# Patient Record
Sex: Female | Born: 1980 | Race: White | Hispanic: No | Marital: Married | Smoking: Never smoker
Health system: Southern US, Community
[De-identification: ages and names within clinical notes are randomized; demographics above are authoritative.]

## PROBLEM LIST (undated history)

## (undated) DIAGNOSIS — F909 Attention-deficit hyperactivity disorder, unspecified type: Secondary | ICD-10-CM

## (undated) DIAGNOSIS — F32A Depression, unspecified: Secondary | ICD-10-CM

## (undated) DIAGNOSIS — F419 Anxiety disorder, unspecified: Secondary | ICD-10-CM

## (undated) DIAGNOSIS — O24419 Gestational diabetes mellitus in pregnancy, unspecified control: Secondary | ICD-10-CM

## (undated) DIAGNOSIS — J45909 Unspecified asthma, uncomplicated: Secondary | ICD-10-CM

## (undated) DIAGNOSIS — F329 Major depressive disorder, single episode, unspecified: Secondary | ICD-10-CM

## (undated) DIAGNOSIS — R87619 Unspecified abnormal cytological findings in specimens from cervix uteri: Secondary | ICD-10-CM

## (undated) DIAGNOSIS — T7840XA Allergy, unspecified, initial encounter: Secondary | ICD-10-CM

## (undated) DIAGNOSIS — Z87820 Personal history of traumatic brain injury: Secondary | ICD-10-CM

## (undated) DIAGNOSIS — K219 Gastro-esophageal reflux disease without esophagitis: Secondary | ICD-10-CM

## (undated) HISTORY — DX: Major depressive disorder, single episode, unspecified: F32.9

## (undated) HISTORY — PX: NO PAST SURGERIES: SHX2092

## (undated) HISTORY — DX: Unspecified asthma, uncomplicated: J45.909

## (undated) HISTORY — DX: Anxiety disorder, unspecified: F41.9

## (undated) HISTORY — PX: LEEP: SHX91

## (undated) HISTORY — DX: Personal history of traumatic brain injury: Z87.820

## (undated) HISTORY — DX: Depression, unspecified: F32.A

## (undated) HISTORY — PX: WISDOM TOOTH EXTRACTION: SHX21

## (undated) HISTORY — DX: Unspecified abnormal cytological findings in specimens from cervix uteri: R87.619

## (undated) HISTORY — DX: Allergy, unspecified, initial encounter: T78.40XA

## (undated) HISTORY — DX: Gestational diabetes mellitus in pregnancy, unspecified control: O24.419

## (undated) HISTORY — DX: Gastro-esophageal reflux disease without esophagitis: K21.9

## (undated) HISTORY — DX: Attention-deficit hyperactivity disorder, unspecified type: F90.9

## (undated) NOTE — *Deleted (*Deleted)
Chief Complaint:  Non-stress Test (further eval )   First Provider Initiated Contact with Patient 07/24/20 1844      HPI: Katrina Phillips is a 46 y.o. G3P1010 at [redacted]w[redacted]d who presents to maternity admissions sent from the office for variable decelerations on NST and irritability on tocometry.  She is in antenatal testing for hx IUFD at 34 weeks with previous pregnancy.  Pt denies feeling any cramping/contractions and is feeling normal fetal movement.      Other This is a new problem. The current episode started today. Pertinent negatives include no abdominal pain, chest pain, chills, fatigue, fever, headaches, nausea or vomiting.    Past Medical History: Past Medical History:  Diagnosis Date  . Abnormal Pap smear of cervix 03/15/2017   HR HPV: +Detected  . ADHD   . Allergy    SEASONAL  . Anxiety   . Asthma    AS A CHILD  . Depression   . H/O concussion 1995    Past obstetric history: OB History  Gravida Para Term Preterm AB Living  3 1 1  0 1 0  SAB TAB Ectopic Multiple Live Births  0 1 0 0 0    # Outcome Date GA Lbr Len/2nd Weight Sex Delivery Anes PTL Lv  3 Current           2 Term 09/08/19 [redacted]w[redacted]d 00:46 / 00:20 2705 g F Vag-Spont EPI  FD  1 TAB 2015            Past Surgical History: Past Surgical History:  Procedure Laterality Date  . COLPOSCOPY  2006  . NO PAST SURGERIES    . WISDOM TOOTH EXTRACTION      Family History: Family History  Problem Relation Age of Onset  . ADD / ADHD Mother   . GER disease Mother   . ADD / ADHD Sister   . Depression Brother   . ADD / ADHD Brother   . Celiac disease Father   . Depression Father   . Depression Maternal Grandmother   . GER disease Maternal Grandfather   . Heart disease Maternal Grandfather   . Celiac disease Paternal Grandmother   . Kidney disease Paternal Grandmother   . Celiac disease Paternal Uncle   . Melanoma Paternal Aunt     Social History: Social History   Tobacco Use  . Smoking status: Never Smoker   . Smokeless tobacco: Never Used  Vaping Use  . Vaping Use: Never used  Substance Use Topics  . Alcohol use: Not Currently    Alcohol/week: 1.0 - 4.0 standard drink    Types: 1 - 4 Standard drinks or equivalent per week    Comment: social  . Drug use: No    Allergies: No Known Allergies  Meds:  Medications Prior to Admission  Medication Sig Dispense Refill Last Dose  . acetaminophen (TYLENOL) 500 MG tablet Take 500 mg by mouth every 6 (six) hours as needed for headache.   Past Month at Unknown time  . aspirin EC 81 MG tablet Take 81 mg by mouth daily. Swallow whole.   07/24/2020 at Unknown time  . ferrous sulfate 325 (65 FE) MG tablet Take 325 mg by mouth daily with breakfast.   07/24/2020 at Unknown time  . metFORMIN (GLUCOPHAGE) 1000 MG tablet Take 1,000 mg by mouth 2 (two) times daily with a meal.   07/24/2020 at Unknown time  . Prenatal Vit-Fe Fumarate-FA (PRENATAL PO) Take 1 tablet by mouth daily.    07/24/2020  at Unknown time  . buPROPion (WELLBUTRIN XL) 300 MG 24 hr tablet Take 1 tablet (300 mg total) by mouth daily. (Patient not taking: Reported on 09/07/2019) 30 tablet 1   . calcium carbonate (TUMS - DOSED IN MG ELEMENTAL CALCIUM) 500 MG chewable tablet Chew 1-2 tablets by mouth as needed for indigestion or heartburn.     Marland Kitchen FLUoxetine (PROZAC) 20 MG tablet Take 1 tablet (20 mg total) by mouth daily. (Patient taking differently: Take 40 mg by mouth daily. ) 30 tablet 1   . ibuprofen (ADVIL) 800 MG tablet Take 1 tablet (800 mg total) by mouth every 8 (eight) hours as needed. 30 tablet 6     ROS:  Review of Systems  Constitutional: Negative for chills, fatigue and fever.  Eyes: Negative for visual disturbance.  Respiratory: Negative for shortness of breath.   Cardiovascular: Negative for chest pain.  Gastrointestinal: Negative for abdominal pain, nausea and vomiting.  Genitourinary: Negative for difficulty urinating, dysuria, flank pain, pelvic pain, vaginal bleeding, vaginal  discharge and vaginal pain.  Neurological: Negative for dizziness and headaches.  Psychiatric/Behavioral: Negative.      I have reviewed patient's Past Medical Hx, Surgical Hx, Family Hx, Social Hx, medications and allergies.   Physical Exam   Patient Vitals for the past 24 hrs:  BP Temp Temp src Pulse Resp SpO2 Weight  07/24/20 2039 110/70 - - - - - -  07/24/20 2016 107/68 - - - - - -  07/24/20 1822 - - - - - - 89 kg  07/24/20 1820 116/70 - - 88 - 94 % -  07/24/20 1818 - 98.7 F (37.1 C) Oral - 12 94 % -  07/24/20 1756 - - - - - - 89 kg   Constitutional: Well-developed, well-nourished female in no acute distress.  Cardiovascular: normal rate Respiratory: normal effort GI: Abd soft, non-tender, gravid appropriate for gestational age.  MS: Extremities nontender, no edema, normal ROM Neurologic: Alert and oriented x 4.  GU: Neg CVAT.    Dilation: 1.5 Effacement (%): 60 Cervical Position: Posterior Presentation: Vertex Exam by:: Sharen Counter, CNM  FHT:  Baseline 135 , moderate variability, accelerations present, no decelerations Contractions: q 2-3 minutes, mild to palpation   Labs: Results for orders placed or performed during the hospital encounter of 07/24/20 (from the past 24 hour(s))  Urinalysis, Routine w reflex microscopic Urine, Clean Catch     Status: Abnormal   Collection Time: 07/24/20  7:02 PM  Result Value Ref Range   Color, Urine YELLOW YELLOW   APPearance HAZY (A) CLEAR   Specific Gravity, Urine 1.014 1.005 - 1.030   pH 6.0 5.0 - 8.0   Glucose, UA NEGATIVE NEGATIVE mg/dL   Hgb urine dipstick NEGATIVE NEGATIVE   Bilirubin Urine NEGATIVE NEGATIVE   Ketones, ur 5 (A) NEGATIVE mg/dL   Protein, ur NEGATIVE NEGATIVE mg/dL   Nitrite NEGATIVE NEGATIVE   Leukocytes,Ua TRACE (A) NEGATIVE   RBC / HPF 0-5 0 - 5 RBC/hpf   WBC, UA 0-5 0 - 5 WBC/hpf   Bacteria, UA RARE (A) NONE SEEN   Squamous Epithelial / LPF 0-5 0 - 5   Mucus PRESENT   Fetal  fibronectin     Status: None   Collection Time: 07/24/20  7:16 PM  Result Value Ref Range   Fetal Fibronectin NEGATIVE NEGATIVE   --/--/A POS, A POS Performed at [facility], [address] [place], [place] [address]  (11/26 0900)  Imaging:  No  results found.  MAU Course/MDM: Orders Placed This Encounter  Procedures  . [place] MFM OB Limited  . Urinalysis, Routine w reflex microscopic Urine, Clean Catch  . Fetal fibronectin    Meds ordered this encounter  Medications  . NIFEdipine (PROCARDIA) capsule 10 mg     NST reviewed and reactive Limited OB US  Report to Wynelle Bourgeois, CNM   Sharen Counter Certified Nurse-Midwife 07/24/2020 9:06 PM

---
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
## 2004-10-12 HISTORY — PX: COLPOSCOPY: SHX161

## 2016-05-14 DIAGNOSIS — Z23 Encounter for immunization: Secondary | ICD-10-CM | POA: Diagnosis not present

## 2016-06-25 DIAGNOSIS — F3341 Major depressive disorder, recurrent, in partial remission: Secondary | ICD-10-CM | POA: Diagnosis not present

## 2016-07-21 DIAGNOSIS — F3341 Major depressive disorder, recurrent, in partial remission: Secondary | ICD-10-CM | POA: Diagnosis not present

## 2016-07-23 DIAGNOSIS — F411 Generalized anxiety disorder: Secondary | ICD-10-CM | POA: Diagnosis not present

## 2016-08-03 DIAGNOSIS — R5383 Other fatigue: Secondary | ICD-10-CM | POA: Diagnosis not present

## 2016-08-03 DIAGNOSIS — D229 Melanocytic nevi, unspecified: Secondary | ICD-10-CM | POA: Diagnosis not present

## 2016-08-03 DIAGNOSIS — G44209 Tension-type headache, unspecified, not intractable: Secondary | ICD-10-CM | POA: Diagnosis not present

## 2016-08-03 DIAGNOSIS — R109 Unspecified abdominal pain: Secondary | ICD-10-CM | POA: Diagnosis not present

## 2016-08-17 DIAGNOSIS — F3341 Major depressive disorder, recurrent, in partial remission: Secondary | ICD-10-CM | POA: Diagnosis not present

## 2016-09-22 ENCOUNTER — Encounter: Payer: Self-pay | Admitting: Gastroenterology

## 2016-09-25 DIAGNOSIS — R5383 Other fatigue: Secondary | ICD-10-CM | POA: Diagnosis not present

## 2016-09-29 DIAGNOSIS — F3341 Major depressive disorder, recurrent, in partial remission: Secondary | ICD-10-CM | POA: Diagnosis not present

## 2016-10-01 DIAGNOSIS — R197 Diarrhea, unspecified: Secondary | ICD-10-CM | POA: Diagnosis not present

## 2016-10-13 DIAGNOSIS — D2271 Melanocytic nevi of right lower limb, including hip: Secondary | ICD-10-CM | POA: Diagnosis not present

## 2016-10-13 DIAGNOSIS — D210 Benign neoplasm of connective and other soft tissue of head, face and neck: Secondary | ICD-10-CM | POA: Diagnosis not present

## 2016-10-13 DIAGNOSIS — D485 Neoplasm of uncertain behavior of skin: Secondary | ICD-10-CM | POA: Diagnosis not present

## 2016-11-03 ENCOUNTER — Encounter: Payer: Self-pay | Admitting: *Deleted

## 2016-11-09 ENCOUNTER — Encounter: Payer: Self-pay | Admitting: Gastroenterology

## 2016-11-09 ENCOUNTER — Encounter (INDEPENDENT_AMBULATORY_CARE_PROVIDER_SITE_OTHER): Payer: Self-pay

## 2016-11-09 ENCOUNTER — Ambulatory Visit (INDEPENDENT_AMBULATORY_CARE_PROVIDER_SITE_OTHER): Payer: BLUE CROSS/BLUE SHIELD | Admitting: Gastroenterology

## 2016-11-09 VITALS — BP 120/70 | HR 76 | Ht 68.0 in | Wt 179.0 lb

## 2016-11-09 DIAGNOSIS — R197 Diarrhea, unspecified: Secondary | ICD-10-CM | POA: Diagnosis not present

## 2016-11-09 DIAGNOSIS — Z8379 Family history of other diseases of the digestive system: Secondary | ICD-10-CM

## 2016-11-09 DIAGNOSIS — R110 Nausea: Secondary | ICD-10-CM | POA: Diagnosis not present

## 2016-11-09 DIAGNOSIS — K219 Gastro-esophageal reflux disease without esophagitis: Secondary | ICD-10-CM | POA: Diagnosis not present

## 2016-11-09 MED ORDER — RANITIDINE HCL 150 MG PO TABS: 150.0000 mg | ORAL_TABLET | Freq: Two times a day (BID) | ORAL | 3 refills | Status: DC

## 2016-11-09 NOTE — Progress Notes (Signed)
Katrina Phillips    [id number]    Nov 16, 1980  Primary Care Physician:Jennifer Owens Shark, NP  Referring Physician: No referring provider defined for this encounter.  Chief complaint: Nausea, Diarrhea, Heartburn, Abdominal cramps  HPI: 36 year old female here for new patient evaluation with complaints of intermittent nausea, diarrhea and abdominal cramps associated with bloating for the past many years She also has had heartburn for many years, thinks she had heartburn even as a teenager and had some testing done at the time in [place] that was unremarkable per patient. Arterburn is mostly controlled with over-the-counter Tums or Pepto-Bismol. She has family history of celiac disease in her paternal grandmother, paternal uncle and her father. Quality really stopped eating gluten about 4 years ago though cannot rule out accidental consumption as she does eat out frequently. She has about 6-7 semi-formed bowel movements a day on her worst days. She had celiac panel tested by PMD that came back negative after she challenged herself with gluten for a week.  Denies any dysphagia, vomiting, abdominal pain, melena or blood per rectum.   Outpatient Encounter Prescriptions as of 11/09/2016  Medication Sig  . amphetamine-dextroamphetamine (ADDERALL) 10 MG tablet Take 10 mg by mouth daily.  Marland Kitchen FLUoxetine (PROZAC) 40 MG capsule Take 1 capsule by mouth daily.  Marland Kitchen VYVANSE 20 MG capsule Take 20 mg by mouth daily.   No facility-administered encounter medications on file as of 11/09/2016.     Allergies as of 11/09/2016  . (No Known Allergies)    Past Medical History:  Diagnosis Date  . ADHD   . Asthma   . Depression   . H/O concussion 1995    Past Surgical History:  Procedure Laterality Date  . COLPOSCOPY  2006  . WISDOM TOOTH EXTRACTION      Family History  Problem Relation Age of Onset  . ADD / ADHD Mother   . GER disease Mother   . ADD / ADHD Sister   . Depression  Brother   . ADD / ADHD Brother   . Celiac disease Father   . Depression Father   . Depression Maternal Grandmother   . GER disease Maternal Grandfather   . Heart disease Maternal Grandfather   . Celiac disease Paternal Grandmother   . Kidney disease Paternal Grandmother   . Celiac disease Paternal Uncle   . Melanoma Paternal Aunt     Social History   Social History  . Marital status: Single    Spouse name: N/A  . Number of children: 0  . Years of education: N/A   Occupational History  . grad student/assistant    Social History Main Topics  . Smoking status: Never Smoker  . Smokeless tobacco: Never Used  . Alcohol use 1.2 oz/week    2 Glasses of wine per week     Comment: social  . Drug use: No  . Sexual activity: Not on file   Other Topics Concern  . Not on file   Social History Narrative  . No narrative on file      Review of systems: Review of Systems  Constitutional: Negative for fever and chills.  HENT: Negative.   Eyes: Negative for blurred vision.  Respiratory: Negative for cough, shortness of breath and wheezing.   Cardiovascular: Negative for chest pain and palpitations.  Gastrointestinal: as per HPI Genitourinary: Negative for dysuria, urgency, frequency and hematuria.  Musculoskeletal: Negative for myalgias, back pain and joint pain.  Skin:  Negative for itching and rash.  Neurological: Negative for dizziness, tremors, focal weakness, seizures and loss of consciousness.  Endo/Heme/Allergies: Positive for seasonal allergies.  Psychiatric/Behavioral: Negative for depression, suicidal ideas and hallucinations.  All other systems reviewed and are negative.   Physical Exam: Vitals:   11/09/16 1353  BP: 120/70  Pulse: 76   Body mass index is 27.22 kg/m. Gen:      No acute distress HEENT:  EOMI, sclera anicteric Neck:     No masses; no thyromegaly Lungs:    Clear to auscultation bilaterally; normal respiratory effort CV:         Regular rate  and rhythm; no murmurs Abd:      + bowel sounds; soft, non-tender; no palpable masses, no distension Ext:    No edema; adequate peripheral perfusion Skin:      Warm and dry; no rash Neuro: alert and oriented x 3 Psych: normal mood and affect  Data Reviewed:  Reviewed labs, radiology imaging, old records and pertinent past GI work up   Assessment and Plan/Recommendations:  36 year old female with chronic history of intermittent nausea, heartburn, diarrhea associated with bloating and abdominal cramps She has family history of celiac disease but recent celiac panel checked by PMD after challenge of gluten diet for a week was negative  Heartburn and nausea could be secondary to untreated gastroesophageal reflux disease Start ranitidine 150 mg twice daily when necessary Antireflux measures  Diarrhea, bloating and abdominal cramps: Advised patient to do a trial of lactose-free diet for 2 weeks to see if has any improvement of symptoms We'll rechallenge patient with gluten for 2 weeks and recheck a TTG, IgA, ferritin, iron panel and also check for HLA DQ 2 and DQ 8 Schedule for EGD with duodenal biopsies The risks and benefits as well as alternatives of endoscopic procedure(s) have been discussed and reviewed. All questions answered. The patient agrees to proceed.  Greater than 50% of the time used for counseling as well as treatment plan and follow-up. She had multiple questions which were answered to her satisfaction  K. Denzil Magnuson , MD [phone] Mon-Fri 8a-5p [phone] after 5p, weekends, holidays  CC: No ref. provider found

## 2016-11-09 NOTE — Patient Instructions (Signed)
If you are age 34 or older, your body mass index should be between 23-30. Your Body mass index is 27.22 kg/m. If this is out of the aforementioned range listed, please consider follow up with your Primary Care Provider.  If you are age 66 or younger, your body mass index should be between 19-25. Your Body mass index is 27.22 kg/m. If this is out of the aformentioned range listed, please consider follow up with your Primary Care Provider.    Lactose-Free Diet, Adult Introduction If you have lactose intolerance, you are not able to digest lactose. Lactose is a natural sugar found mainly in milk and milk products. You may need to avoid all foods and beverages that contain lactose. A lactose-free diet can help you do this. What do I need to know about this diet?  Do not consume foods, beverages, vitamins, minerals, or medicines with lactose. Read ingredients lists carefully.  Look for the words "lactose-free" on labels.  Use lactase enzyme drops or tablets as directed by your health care provider.  Use lactose-free milk or a milk alternative, such as soy milk, for drinking and cooking.  Make sure you get enough calcium and vitamin D in your diet. A lactose-free eating plan can be lacking in these important nutrients.  Take calcium and vitamin D supplements as directed by your health care provider. Talk to your provider about supplements if you are not able to get enough calcium and vitamin D from food. Which foods have lactose? Lactose is found in:  Milk and foods made from milk.  Yogurt.  Cheese.  Butter.  Margarine.  Sour cream.  Cream.  Whipped toppings and nondairy creamers.  Ice cream and other milk-based desserts. Lactose is also found in foods or products made with milk or milk ingredients. To find out whether a food contains milk or a milk ingredient, look at the ingredients list. Avoid foods with the statement "May contain milk" and foods that  contain:  Butter.  Cream.  Milk.  Milk solids.  Milk powder.  Whey.  Curd.  Caseinate.  Lactose.  Lactalbumin.  Lactoglobulin. What are some alternatives to milk and foods made with milk products?  Lactose-free milk.  Soy milk with added calcium and vitamin D.  Almond, coconut, or rice milk with added calcium and vitamin D. Note that these are low in protein.  Soy products, such as soy yogurt, soy cheese, soy ice cream, and soy-based sour cream. Which foods can I eat? Grains  Breads and rolls made without milk, such as [place], [place], or [place] bread, bagels, pita, and [employer]. Corn tortillas, corn meal, grits, and polenta. Crackers without lactose or milk solids, such as soda crackers and graham crackers. Cooked or dry cereals without lactose or milk solids. Pasta, quinoa, couscous, barley, oats, bulgur, farro, rice, wild rice, or other grains prepared without milk or lactose. Plain popcorn. Vegetables  Fresh, frozen, and canned vegetables without cheese, cream, or butter sauces. Fruits  All fresh, canned, frozen, or dried fruits that are not processed with lactose. Meats and Other Protein Sources  Plain beef, chicken, fish, [place], lamb, veal, pork, wild game, or ham. Kosher-prepared meat products. Strained or junior meats that do not contain milk. Eggs. Soy meat substitutes. Beans, lentils, and hummus. Tofu. Nuts and seeds. Peanut or other nut butters without lactose. Soups, casseroles, and mixed dishes without cheese, cream, or milk. Dairy  Lactose-free milk. Soy, rice, or almond milk with added calcium and vitamin D. Soy cheese and yogurt.  Beverages  Carbonated drinks. Tea. Coffee, freeze-dried coffee, and some instant coffees. Fruit and vegetable juices. Condiments  Soy sauce. Carob powder. Olives. Gravy made with water. Baker's cocoa. Rosita FirePickles. Pure seasonings and spices. Ketchup. Mustard. Bouillon. Broth. Sweets and Desserts  Water and fruit ices. Gelatin.  Cookies, pies, or cakes made from allowed ingredients, such as angel food cake. Pudding made with water or a milk substitute. Lactose-free tofu desserts. Soy, coconut milk, or rice-milk-based frozen desserts. Sugar. Honey. Jam, jelly, and marmalade. Molasses. Pure sugar candy. Dark chocolate without milk. Marshmallows. Fats and Oils  Margarines and salad dressings that do not contain milk. Tomasa BlaseBacon. Vegetable oils. Shortening. Mayonnaise. Soy or coconut-based cream. The items listed above may not be a complete list of recommended foods or beverages. Contact your dietitian for more options.  Which foods are not recommended? Grains  Breads and rolls that contain milk. Toaster pastries. Muffins, biscuits, waffles, cornbread, and pancakes. These can be prepared at home, commercial, or from mixes. Sweet rolls, donuts, English muffins, fry bread, lefse, flour tortillas with lactose, or [place] toast made with milk or milk ingredients. Crackers that contain lactose. Corn curls. Cooked or dry cereals with lactose. Vegetables  Creamed or breaded vegetables. Vegetables in a cheese or butter sauce or with lactose-containing margarines. Instant potatoes. [place] fries. Scalloped or au gratin potatoes. Fruits  None. Meats and Other Protein Sources  Scrambled eggs, omelets, and souffles that contain milk. Creamed or breaded meat, fish, chicken, or [place]. Sausage products, such as wieners and liver sausage. Cold cuts that contain milk solids. Cheese, cottage cheese, ricotta cheese, and cheese spreads. Lasagna and macaroni and cheese. Pizza. Peanut or other nut butters with added milk solids. Casseroles or mixed dishes containing milk or cheese. Dairy  All dairy products, including milk, goat's milk, buttermilk, kefir, acidophilus milk, flavored milk, evaporated milk, condensed milk, dulce de [place], eggnog, yogurt, cheese, and cheese spreads. Beverages  Hot chocolate. Cocoa with lactose. Instant iced teas. Powdered  fruit drinks. Smoothies made with milk or yogurt. Condiments  Chewing gum that has lactose. Cocoa that has lactose. Spice blends if they contain milk products. Artificial sweeteners that contain lactose. Nondairy creamers. Sweets and Desserts  Ice cream, ice milk, gelato, sherbet, and frozen yogurt. Custard, pudding, and mousse. Cake, cream pies, cookies, and other desserts containing milk, cream, cream cheese, or milk chocolate. Pie crust made with milk-containing margarine or butter. Reduced-calorie desserts made with a sugar substitute that contains lactose. Toffee and butterscotch. Milk, white, or dark chocolate that contains milk. Fudge. Caramel. Fats and Oils  Margarines and salad dressings that contain milk or cheese. Cream. Half and half. Cream cheese. Sour cream. Chip dips made with sour cream or yogurt. The items listed above may not be a complete list of foods and beverages to avoid. Contact your dietitian for more information.  Am I getting enough calcium? Calcium is found in many foods that contain lactose and is important for bone health. The amount of calcium you need depends on your age:  Adults younger than 50 years: 1000 mg of calcium a day.  Adults older than 50 years: 1200 mg of calcium a day. If you are not getting enough calcium, other calcium sources include:  Orange juice with calcium added. There are 300-350 mg of calcium in 1 cup of orange juice.  Sardines with edible bones. There are 325 mg of calcium in 3 oz of sardines.  Calcium-fortified soy milk. There are 300-400 mg of calcium in 1 cup of calcium-fortified  soy milk.  Calcium-fortified rice or almond milk. There are 300 mg of calcium in 1 cup of calcium-fortified rice or almond milk.  Canned salmon with edible bones. There are 180 mg of calcium in 3 oz of canned salmon with edible bones.  Calcium-fortified breakfast cereals. There are [phone] mg of calcium in calcium-fortified breakfast cereals.  Tofu set  with calcium sulfate. There are 250 mg of calcium in  cup of tofu set with calcium sulfate.  Spinach, cooked. There are 145 mg of calcium in  cup of cooked spinach.  Edamame, cooked. There are 130 mg of calcium in  cup of cooked edamame.  Collard greens, cooked. There are 125 mg of calcium in  cup of cooked collard greens.  Kale, frozen or cooked. There are 90 mg of calcium in  cup of cooked or frozen kale.  Almonds. There are 95 mg of calcium in  cup of almonds.  Broccoli, cooked. There are 60 mg of calcium in 1 cup of cooked broccoli. This information is not intended to replace advice given to you by your health care provider. Make sure you discuss any questions you have with your health care provider. Document Released: 03/20/2002 Document Revised: 03/05/2016 Document Reviewed: 12/29/2013  2017 Elsevier  Eat gluten until your lab appointment, go to the basement before your procedure

## 2016-11-27 ENCOUNTER — Encounter: Payer: Self-pay | Admitting: Gastroenterology

## 2016-12-04 ENCOUNTER — Telehealth: Payer: Self-pay

## 2016-12-04 ENCOUNTER — Ambulatory Visit (AMBULATORY_SURGERY_CENTER): Payer: BLUE CROSS/BLUE SHIELD | Admitting: Gastroenterology

## 2016-12-04 ENCOUNTER — Encounter: Payer: Self-pay | Admitting: Gastroenterology

## 2016-12-04 ENCOUNTER — Other Ambulatory Visit (INDEPENDENT_AMBULATORY_CARE_PROVIDER_SITE_OTHER): Payer: BLUE CROSS/BLUE SHIELD

## 2016-12-04 VITALS — BP 115/76 | HR 63 | Temp 98.9°F | Resp 12 | Ht 68.0 in | Wt 179.0 lb

## 2016-12-04 DIAGNOSIS — R1013 Epigastric pain: Secondary | ICD-10-CM

## 2016-12-04 DIAGNOSIS — R197 Diarrhea, unspecified: Secondary | ICD-10-CM

## 2016-12-04 DIAGNOSIS — R140 Abdominal distension (gaseous): Secondary | ICD-10-CM

## 2016-12-04 DIAGNOSIS — R110 Nausea: Secondary | ICD-10-CM

## 2016-12-04 DIAGNOSIS — Z8379 Family history of other diseases of the digestive system: Secondary | ICD-10-CM

## 2016-12-04 DIAGNOSIS — K2950 Unspecified chronic gastritis without bleeding: Secondary | ICD-10-CM | POA: Diagnosis not present

## 2016-12-04 DIAGNOSIS — K219 Gastro-esophageal reflux disease without esophagitis: Secondary | ICD-10-CM

## 2016-12-04 LAB — IBC PANEL
Iron: 157 ug/dL — ABNORMAL HIGH (ref 42–145)
Saturation Ratios: 39.1 % (ref 20.0–50.0)
Transferrin: 287 mg/dL (ref 212.0–360.0)

## 2016-12-04 LAB — IGA: IgA: 151 mg/dL (ref 68–378)

## 2016-12-04 LAB — FERRITIN: Ferritin: 9.8 ng/mL — ABNORMAL LOW (ref 10.0–291.0)

## 2016-12-04 MED ORDER — SODIUM CHLORIDE 0.9 % IV SOLN: 500.0000 mL | INTRAVENOUS | Status: DC

## 2016-12-04 NOTE — Patient Instructions (Addendum)
YOU HAD AN ENDOSCOPIC PROCEDURE TODAY AT THE [facility]:   Refer to the procedure report that was given to you for any specific questions about what was found during the examination.  If the procedure report does not answer your questions, please call your gastroenterologist to clarify.  If you requested that your care partner not be given the details of your procedure findings, then the procedure report has been included in a sealed envelope for you to review at your convenience later.  YOU SHOULD EXPECT: Some feelings of bloating in the abdomen. Passage of more gas than usual.  Walking can help get rid of the air that was put into your GI tract during the procedure and reduce the bloating. If you had a lower endoscopy (such as a colonoscopy or flexible sigmoidoscopy) you may notice spotting of blood in your stool or on the toilet paper. If you underwent a bowel prep for your procedure, you may not have a normal bowel movement for a few days.  Please Note:  You might notice some irritation and congestion in your nose or some drainage.  This is from the oxygen used during your procedure.  There is no need for concern and it should clear up in a day or so.  SYMPTOMS TO REPORT IMMEDIATELY:    Following upper endoscopy (EGD)  Vomiting of blood or coffee ground material  New chest pain or pain under the shoulder blades  Painful or persistently difficult swallowing  New shortness of breath  Fever of 100F or higher  Black, tarry-looking stools  For urgent or emergent issues, a gastroenterologist can be reached at any hour by calling [phone].   DIET:  We do recommend a small meal at first, but then you may proceed to your regular diet.  Drink plenty of fluids but you should avoid alcoholic beverages for 24 hours.  ACTIVITY:  You should plan to take it easy for the rest of today and you should NOT DRIVE or use heavy machinery until tomorrow (because of the sedation medicines used  during the test).    FOLLOW UP: Our staff will call the number listed on your records the next business day following your procedure to check on you and address any questions or concerns that you may have regarding the information given to you following your procedure. If we do not reach you, we will leave a message.  However, if you are feeling well and you are not experiencing any problems, there is no need to return our call.  We will assume that you have returned to your regular daily activities without incident.  If any biopsies were taken you will be contacted by phone or by letter within the next 1-3 weeks.  Please call us at [phone] if you have not heard about the biopsies in 3 weeks.   Await for biopsy results No need to repeat Endoscopy Return to clinic as needed   SIGNATURES/CONFIDENTIALITY: You and/or your care partner have signed paperwork which will be entered into your electronic medical record.  These signatures attest to the fact that that the information above on your After Visit Summary has been reviewed and is understood.  Full responsibility of the confidentiality of this discharge information lies with you and/or your care-partner.

## 2016-12-04 NOTE — Telephone Encounter (Signed)
Dr. Lavon PaganiniNandigam asked us to call to see if patient could come in at 1230 for a 1330 procedure instead of her scheduled time of 1530. Patient states she is unable to do that because she will be at work until 1300. Patient will arrive at her previously scheduled time.

## 2016-12-04 NOTE — Progress Notes (Signed)
Report to PACU, RN, vss, BBS= Clear.  

## 2016-12-04 NOTE — Op Note (Signed)
[facility] Patient Name: Katrina Phillips Procedure Date: 12/04/2016 3:07 PM MRN: [id number] Endoscopist: Napoleon Form , MD Age: 36 Referring MD:  Date of Birth: 06-18-1981 Gender: Female Account #: [id number] Procedure:                Upper GI endoscopy Indications:              Endoscopy to assess diarrhea in patient suspected                            of having celiac disease, Dyspepsia Medicines:                Monitored Anesthesia Care Procedure:                Pre-Anesthesia Assessment:                           - Prior to the procedure, a History and Physical                            was performed, and patient medications and                            allergies were reviewed. The patient's tolerance of                            previous anesthesia was also reviewed. The risks                            and benefits of the procedure and the sedation                            options and risks were discussed with the patient.                            All questions were answered, and informed consent                            was obtained. Prior Anticoagulants: The patient has                            taken no previous anticoagulant or antiplatelet                            agents. ASA Grade Assessment: II - A patient with                            mild systemic disease. After reviewing the risks                            and benefits, the patient was deemed in                            satisfactory condition to undergo the procedure.  After obtaining informed consent, the endoscope was                            passed under direct vision. Throughout the                            procedure, the patient's blood pressure, pulse, and                            oxygen saturations were monitored continuously. The                            Model GIF-HQ190 [id number]) scope was introduced                            through the  mouth, and advanced to the second part                            of duodenum. The upper GI endoscopy was                            accomplished without difficulty. The patient                            tolerated the procedure well. Scope In: Scope Out: Findings:                 The esophagus was normal.                           The entire examined stomach was normal. Biopsies                            were taken with a cold forceps for Helicobacter                            pylori testing.                           The duodenal bulb and second portion of the                            duodenum were normal. Biopsies were taken with a                            cold forceps for histology. Complications:            No immediate complications. Estimated Blood Loss:     Estimated blood loss was minimal. Impression:               - Normal esophagus.                           - Normal stomach. Biopsied.                           - Normal duodenal  bulb and second portion of the                            duodenum. Biopsied. Recommendation:           - Resume previous diet.                           - Continue present medications.                           - Await pathology results.                           - No repeat upper endoscopy.                           - Return to GI office PRN. Napoleon FormKavitha V. Dariah Mcsorley, MD 12/04/2016 3:27:12 PM This report has been signed electronically.

## 2016-12-04 NOTE — Progress Notes (Signed)
Called to room to assist during endoscopic procedure.  Patient ID and intended procedure confirmed with present staff. Received instructions for my participation in the procedure from the performing physician.  

## 2016-12-07 ENCOUNTER — Telehealth: Payer: Self-pay | Admitting: *Deleted

## 2016-12-07 DIAGNOSIS — F3341 Major depressive disorder, recurrent, in partial remission: Secondary | ICD-10-CM | POA: Diagnosis not present

## 2016-12-07 LAB — TISSUE TRANSGLUTAMINASE, IGA: Tissue Transglutaminase Ab, IgA: 1 U/mL (ref <4)

## 2016-12-07 NOTE — Telephone Encounter (Signed)
No answer will attempt to call back later this afternoon for follow up. SM 

## 2016-12-11 ENCOUNTER — Encounter: Payer: Self-pay | Admitting: Gastroenterology

## 2016-12-25 ENCOUNTER — Telehealth: Payer: Self-pay | Admitting: Gastroenterology

## 2016-12-25 NOTE — Telephone Encounter (Signed)
Left message for pt to call back  °

## 2017-03-15 DIAGNOSIS — Z01419 Encounter for gynecological examination (general) (routine) without abnormal findings: Secondary | ICD-10-CM | POA: Diagnosis not present

## 2017-03-15 DIAGNOSIS — R87619 Unspecified abnormal cytological findings in specimens from cervix uteri: Secondary | ICD-10-CM

## 2017-03-15 HISTORY — DX: Unspecified abnormal cytological findings in specimens from cervix uteri: R87.619

## 2017-03-22 DIAGNOSIS — F3341 Major depressive disorder, recurrent, in partial remission: Secondary | ICD-10-CM | POA: Diagnosis not present

## 2017-04-13 ENCOUNTER — Telehealth: Payer: Self-pay | Admitting: Obstetrics and Gynecology

## 2017-04-13 NOTE — Telephone Encounter (Signed)
Called and left a message for patient to call back to schedule a new patient doctor referral for a colposcopy.  °

## 2017-04-29 ENCOUNTER — Ambulatory Visit (INDEPENDENT_AMBULATORY_CARE_PROVIDER_SITE_OTHER): Payer: BLUE CROSS/BLUE SHIELD | Admitting: Obstetrics and Gynecology

## 2017-04-29 ENCOUNTER — Encounter: Payer: Self-pay | Admitting: Obstetrics and Gynecology

## 2017-04-29 VITALS — BP 112/80 | HR 100 | Resp 16 | Ht 68.25 in | Wt 177.0 lb

## 2017-04-29 DIAGNOSIS — Z01812 Encounter for preprocedural laboratory examination: Secondary | ICD-10-CM | POA: Diagnosis not present

## 2017-04-29 DIAGNOSIS — N920 Excessive and frequent menstruation with regular cycle: Secondary | ICD-10-CM | POA: Diagnosis not present

## 2017-04-29 DIAGNOSIS — Z30431 Encounter for routine checking of intrauterine contraceptive device: Secondary | ICD-10-CM

## 2017-04-29 DIAGNOSIS — N871 Moderate cervical dysplasia: Secondary | ICD-10-CM | POA: Diagnosis not present

## 2017-04-29 DIAGNOSIS — N72 Inflammatory disease of cervix uteri: Secondary | ICD-10-CM | POA: Diagnosis not present

## 2017-04-29 DIAGNOSIS — R87611 Atypical squamous cells cannot exclude high grade squamous intraepithelial lesion on cytologic smear of cervix (ASC-H): Secondary | ICD-10-CM

## 2017-04-29 DIAGNOSIS — R87619 Unspecified abnormal cytological findings in specimens from cervix uteri: Secondary | ICD-10-CM | POA: Insufficient documentation

## 2017-04-29 LAB — POCT URINE PREGNANCY: Preg Test, Ur: NEGATIVE

## 2017-04-29 NOTE — Progress Notes (Signed)
GYNECOLOGY  VISIT   HPI: 36 y.o.   Single  Caucasian  female   G1P0010 with Patient's last menstrual period was 03/30/2017 (approximate).   here for a consultations for an abnormal pap from Dr Claudell [facility]aci [facility]Mill[facility]. Pap from 6/18 returned with ASCUS can't r/o HGSIL She had an abnormal pap in 2004, had a colposcopy, no treatment. She then had normal paps until 2 years ago. 2 years ago, she had an "abnormal pap", but she didn't need a colposcopy.  She is in graduate school for mental health at [employer] Menses q 5 weeks x 8-10 days. Saturates a regular tampon in 3 hours. No BTB. Cramps are moderate, helped with OTC medication. She has a paragard IUD.   GYNECOLOGIC HISTORY: Patient's last menstrual period was 03/30/2017 (approximate). Contraception: IUD, paragard IUD placed about 5 years ago Menopausal hormone therapy: none        OB History    Gravida Para Term Preterm AB Living   1       1     SAB TAB Ectopic Multiple Live Births     1               Patient Active Problem List   Diagnosis Date Noted  . Abnormal Pap smear of cervix     Past Medical History:  Diagnosis Date  . Abnormal Pap smear of cervix 03/15/2017   HR HPV: +Detected  . ADHD   . Allergy    SEASONAL  . Anxiety   . Asthma    AS A CHILD  . Depression   . H/O concussion 1995    Past Surgical History:  Procedure Laterality Date  . COLPOSCOPY  2006  . WISDOM TOOTH EXTRACTION      Current Outpatient Prescriptions  Medication Sig Dispense Refill  . amphetamine-dextroamphetamine (ADDERALL) 10 MG tablet Take 10 mg by mouth daily.  0  . buPROPion (WELLBUTRIN XL) 150 MG 24 hr tablet Take 150 mg by mouth daily.  0  . FLUoxetine (PROZAC) 40 MG capsule Take 1 capsule by mouth daily.  0  . IRON PO Take by mouth daily.    Marland Kitchen. VYVANSE 20 MG capsule Take 20 mg by mouth daily.  0   Current Facility-Administered Medications  Medication Dose Route Frequency Provider Last Rate Last Dose  . 0.9 %  sodium chloride  infusion  500 mL Intravenous Continuous Nandigam, Eleonore ChiquitoKavitha V, MD         ALLERGIES: Patient has no known allergies.  Family History  Problem Relation Age of Onset  . ADD / ADHD Mother   . GER disease Mother   . ADD / ADHD Sister   . Depression Brother   . ADD / ADHD Brother   . Celiac disease Father   . Depression Father   . Depression Maternal Grandmother   . GER disease Maternal Grandfather   . Heart disease Maternal Grandfather   . Celiac disease Paternal Grandmother   . Kidney disease Paternal Grandmother   . Celiac disease Paternal Uncle   . Melanoma Paternal Aunt     Social History   Social History  . Marital status: Single    Spouse name: N/A  . Number of children: 0  . Years of education: N/A   Occupational History  . grad student/assistant    Social History Main Topics  . Smoking status: Never Smoker  . Smokeless tobacco: Never Used  . Alcohol use 1.2 oz/week    2 Glasses of  wine per week     Comment: social  . Drug use: No  . Sexual activity: Yes    Birth control/ protection: IUD     Comment: ParaGard IUD 2013   Other Topics Concern  . Not on file   Social History Narrative  . No narrative on file    Review of Systems  Constitutional: Negative.   HENT: Negative.   Eyes: Negative.   Respiratory: Negative.   Cardiovascular: Negative.   Gastrointestinal: Negative.   Genitourinary: Negative.   Musculoskeletal: Negative.   Skin: Negative.   Neurological: Negative.   Endo/Heme/Allergies: Negative.   Psychiatric/Behavioral: Negative.     PHYSICAL EXAMINATION:    BP 112/80 (BP Location: Right Arm, Patient Position: Sitting, Cuff Size: Large)   Pulse 100   Resp 16   Ht 5' 8.25" (1.734 m)   Wt 177 lb (80.3 kg)   LMP 03/30/2017 (Approximate)   BMI 26.72 kg/m     General appearance: alert, cooperative and appears stated age  Pelvic: External genitalia:  no lesions              Urethra:  normal appearing urethra with no masses, tenderness or  lesions              Bartholins and Skenes: normal                 Vagina: normal appearing vagina with normal color and discharge, no lesions              Cervix:grossly normal  Colposcopy: satisfactory, aceto-white changes posteriorly, biopsy taken at 6 o'clock. Aceto-white changes with mosaics anteriorly, biopsy at 1 o'clock. ECC done. Biopsy sites treated with silver nitrate. Negative lugols examination of the upper vagina. Some persistent bleeding from the 6 o'clock biopsy site was treated with monsels  Chaperone was present for exam.  ASSESSMENT ASC-H Prolonged bleeding with her IUD    PLAN Colposcopy with biopsies and ECC done Further plans depending on results Discussed changing from the paragard to the [facility], she is getting married and October and they are talking about pregnancy. Will hold for now.    An After Visit Summary was printed and given to the patient.  CC: Dr Claudell Kyle Letter sent

## 2017-04-29 NOTE — Progress Notes (Deleted)
GYNECOLOGY  VISIT   HPI: 36 y.o.   Single  {Race/ethnicity:17218}  female   No obstetric history on file. with No LMP recorded.   here for     GYNECOLOGIC HISTORY: No LMP recorded. Contraception:*** Menopausal hormone therapy: ***        OB History    No data available         There are no active problems to display for this patient.   Past Medical History:  Diagnosis Date  . ADHD   . Allergy    SEASONAL  . Anxiety   . Asthma    AS A CHILD  . Depression   . H/O concussion 1995    Past Surgical History:  Procedure Laterality Date  . COLPOSCOPY  2006  . WISDOM TOOTH EXTRACTION      Current Outpatient Prescriptions  Medication Sig Dispense Refill  . amphetamine-dextroamphetamine (ADDERALL) 10 MG tablet Take 10 mg by mouth daily.  0  . FLUoxetine (PROZAC) 40 MG capsule Take 1 capsule by mouth daily.  0  . ranitidine (ZANTAC) 150 MG tablet Take 1 tablet (150 mg total) by mouth 2 (two) times daily. As needed 60 tablet 3  . VYVANSE 20 MG capsule Take 20 mg by mouth daily.  0   Current Facility-Administered Medications  Medication Dose Route Frequency Provider Last Rate Last Dose  . 0.9 %  sodium chloride infusion  500 mL Intravenous Continuous Nandigam, Eleonore ChiquitoKavitha V, MD         ALLERGIES: Patient has no known allergies.  Family History  Problem Relation Age of Onset  . ADD / ADHD Mother   . GER disease Mother   . ADD / ADHD Sister   . Depression Brother   . ADD / ADHD Brother   . Celiac disease Father   . Depression Father   . Depression Maternal Grandmother   . GER disease Maternal Grandfather   . Heart disease Maternal Grandfather   . Celiac disease Paternal Grandmother   . Kidney disease Paternal Grandmother   . Celiac disease Paternal Uncle   . Melanoma Paternal Aunt     Social History   Social History  . Marital status: Single    Spouse name: N/A  . Number of children: 0  . Years of education: N/A   Occupational History  . grad  student/assistant    Social History Main Topics  . Smoking status: Never Smoker  . Smokeless tobacco: Never Used  . Alcohol use 1.2 oz/week    2 Glasses of wine per week     Comment: social  . Drug use: No  . Sexual activity: Not on file   Other Topics Concern  . Not on file   Social History Narrative  . No narrative on file    ROS  PHYSICAL EXAMINATION:    There were no vitals taken for this visit.    General appearance: alert, cooperative and appears stated age Neck: no adenopathy, supple, symmetrical, trachea midline and thyroid {CHL AMB PHY EX THYROID NORM DEFAULT:[phone]::"normal to inspection and palpation"} Breasts: {Exam; breast:13139::"normal appearance, no masses or tenderness"} Abdomen: soft, non-tender; bowel sounds normal; no masses,  no organomegaly  Pelvic: External genitalia:  no lesions              Urethra:  normal appearing urethra with no masses, tenderness or lesions              Bartholins and Skenes: normal  Vagina: normal appearing vagina with normal color and discharge, no lesions              Cervix: {CHL AMB PHY EX CERVIX NORM [phone]::"no lesions"}              Bimanual Exam:  Uterus:  {CHL AMB PHY EX UTERUS NORM [phone]::"normal size, contour, position, consistency, mobility, non-tender"}              Adnexa: {CHL AMB PHY EX ADNEXA NO MASS DEFAULT:[phone]::"no mass, fullness, tenderness"}              Rectovaginal: {yes no:[phone]}.  Confirms.              Anus:  normal sphincter tone, no lesions  Chaperone was present for exam.  ASSESSMENT     PLAN    An After Visit Summary was printed and given to the patient.  *** minutes face to face time of which over 50% was spent in counseling.

## 2017-04-29 NOTE — Patient Instructions (Signed)

## 2017-04-30 ENCOUNTER — Telehealth: Payer: Self-pay | Admitting: Obstetrics and Gynecology

## 2017-04-30 NOTE — Telephone Encounter (Signed)
[facility] Confirmed requistion received via fax for biopsies dated 04/29/17, will begin processing.   Routing to provider for final review. Will close encounter.

## 2017-04-30 NOTE — Telephone Encounter (Signed)
[facility] says they received biopsies on patient but no paperwork. Her number is [phone] ext [phone].

## 2017-04-30 NOTE — Telephone Encounter (Signed)
Left message to call Darien Mignogna at [phone].  

## 2017-05-03 ENCOUNTER — Telehealth: Payer: Self-pay

## 2017-05-03 DIAGNOSIS — F3341 Major depressive disorder, recurrent, in partial remission: Secondary | ICD-10-CM | POA: Diagnosis not present

## 2017-05-03 NOTE — Telephone Encounter (Signed)
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 ECC was negative.  She does have a paragard IUD. Please advise her that the options are to pull the IUD prior to the procedure and come up with another plan for contraception, or leave the IUD in. The strings will likely get cut and it may be harder to remove the IUD later (we can further discuss options at her visit). Thanks

## 2017-05-03 NOTE — Telephone Encounter (Signed)
Attempted to reach patient at number provided [phone], there was no answer and recording states that the voicemail box is full.

## 2017-05-05 NOTE — Telephone Encounter (Signed)
Attempted to reach patient at number provided [phone], there was ano answer and recording states that the voicemail box is full.

## 2017-05-12 ENCOUNTER — Telehealth: Payer: Self-pay

## 2017-05-12 ENCOUNTER — Encounter: Payer: Self-pay | Admitting: *Deleted

## 2017-05-12 DIAGNOSIS — D069 Carcinoma in situ of cervix, unspecified: Secondary | ICD-10-CM

## 2017-05-12 NOTE — Telephone Encounter (Signed)
Letter to your desk for review. Routing to Dr Oscar LaJertson

## 2017-05-12 NOTE — Telephone Encounter (Signed)
Returned  Call from patient. Colpo results reviewed with patient in detail. Discussed recommendation for LEEP. Brief review of procedure. Discussed options for IUD removal versus potential that strings may be cut during procedure. Patient aware she can discuss this further with Dr Oscar LaJertson at time of procedure but thinks she will plan on IUD removal. Patient has cycle approximately every 5 weeks with Paragard. Planned LEEP for 06-08-17 at 1pm. She is instructed to take Motrin 800 mg one hour prior with food.   Routing to provider for final review. Patient agreeable to disposition. Will close encounter.

## 2017-05-12 NOTE — Telephone Encounter (Signed)
@  nd attempt today to reach patient. Voice mail remains full, unable to leave message. Patient is not active on My Chart.

## 2017-05-12 NOTE — Telephone Encounter (Signed)
Attempted to call patient at #[phone] to discuss pap smear results, no answer and mailbox was full and could not leave a message. Patient needs a letter regarding LEEP procedure. Routed to Billie RuddySally Yeakley, RN, Materials engineerclinical supervisor.

## 2017-05-13 ENCOUNTER — Telehealth: Payer: Self-pay | Admitting: *Deleted

## 2017-05-13 NOTE — Telephone Encounter (Signed)
I'm fine to do the leep on the 14, she has an IUD, no chance of pregnancy. As long as she isn't in the middle of a heavy cycle I don't care where she is in her cycle. I could do it at 2:30 or 3:30 on the 14th as long as the room is open

## 2017-05-13 NOTE — Telephone Encounter (Signed)
Spoke with patient. Patient request to change LEEP appointment from 06/08/17 to earlier date. Paragard IUD in place, menses approximately q5 weeks. Patient states she would like to have procedure done around Aug 14th, prior to school starting. Menses due to start the week of the 20th. Advised patient LEEP is done following menses, patient states she is aware, this was previously discussed. Advised no appointments available for procedure on the requested dates.  Advised patient appointment has not been cancelled for 06/08/17 at this time.  Advised patient would review with  Dr. Oscar LaJertson and return call with any additional recommendations. Patient verbalizes understanding and is agreeable.   Dr. Oscar LaJertson, please review.

## 2017-05-14 NOTE — Telephone Encounter (Addendum)
Spoke with patient, advised as seen below per Dr. Oscar LaJertson. Patient declined 8/14 appointment, states she will keep LEEP as scheduled for 06/08/17. Patient is agreeable and thankful for return call.   Patient is agreeable to disposition. Will close encounter.

## 2017-05-27 NOTE — Telephone Encounter (Signed)
Results given to patient by Billie RuddySally Yeakley, RN on 05/12/17. Please see telephone encounter. LEEP is scheduled for 06/08/17.

## 2017-06-03 ENCOUNTER — Telehealth: Payer: Self-pay | Admitting: *Deleted

## 2017-06-03 NOTE — Telephone Encounter (Signed)
Spoke with patient. Patient scheduled for LEEP procedure on 06/08/17. Patient has paragard IUD in place, states cycles are usually q 5 weeks. Patient states she has not started her menses yet and may need to change LEEP procedure appointment.   Advised patient of Dr. Salli Quarry recommendations as previously discussed, see telephone encounter dated 05/13/17.   Advised patient to keep appointment as scheduled, return call to office if menses starts, can reschedule at that time if menses is heavy. Advised patient would review with Dr. Oscar La and return call with any additional recommendations, patient is agreeable.    Dr. Oscar La, do you agree with recommendations?

## 2017-06-03 NOTE — Telephone Encounter (Signed)
No problem, I can do the leep at any time of her cycle as long as she isn't bleeding heavily.

## 2017-06-03 NOTE — Telephone Encounter (Signed)
Left detailed message, ok per current dpr. Advised as seen below per Dr.Jertson. Keep appointment as scheduled, return call to office to reschedule if menses starts and is heavy.  Will close encounter.

## 2017-06-08 ENCOUNTER — Encounter: Payer: Self-pay | Admitting: Obstetrics and Gynecology

## 2017-06-08 ENCOUNTER — Ambulatory Visit (INDEPENDENT_AMBULATORY_CARE_PROVIDER_SITE_OTHER): Payer: BLUE CROSS/BLUE SHIELD | Admitting: Obstetrics and Gynecology

## 2017-06-08 VITALS — BP 110/60 | HR 72 | Resp 14 | Wt 176.0 lb

## 2017-06-08 DIAGNOSIS — Z3009 Encounter for other general counseling and advice on contraception: Secondary | ICD-10-CM

## 2017-06-08 DIAGNOSIS — N871 Moderate cervical dysplasia: Secondary | ICD-10-CM | POA: Diagnosis not present

## 2017-06-08 DIAGNOSIS — N879 Dysplasia of cervix uteri, unspecified: Secondary | ICD-10-CM | POA: Diagnosis not present

## 2017-06-08 DIAGNOSIS — D069 Carcinoma in situ of cervix, unspecified: Secondary | ICD-10-CM

## 2017-06-08 DIAGNOSIS — Z01812 Encounter for preprocedural laboratory examination: Secondary | ICD-10-CM

## 2017-06-08 DIAGNOSIS — Z9889 Other specified postprocedural states: Secondary | ICD-10-CM

## 2017-06-08 DIAGNOSIS — Z30432 Encounter for removal of intrauterine contraceptive device: Secondary | ICD-10-CM | POA: Diagnosis not present

## 2017-06-08 LAB — POCT URINE PREGNANCY: Preg Test, Ur: NEGATIVE

## 2017-06-08 NOTE — Progress Notes (Signed)
GYNECOLOGY  VISIT   HPI: 36 y.o.   Single  Caucasian  female   G1P0010 with Patient's last menstrual period was 04/29/2017.   here for LEEP for high grade. She also has a paragard IUD and would like that pulled today.  She hasn't done well on hormonal contraception in the past, will use condoms. She is getting married October 6, will have approximately 100 guests. Will need to delay the honeymoon secondary to school.   GYNECOLOGIC HISTORY: Patient's last menstrual period was 04/29/2017. Contraception:IUD Paragard Menopausal hormone therapy: none         OB History    Gravida Para Term Preterm AB Living   1       1     SAB TAB Ectopic Multiple Live Births     1               Patient Active Problem List   Diagnosis Date Noted  . Abnormal Pap smear of cervix     Past Medical History:  Diagnosis Date  . Abnormal Pap smear of cervix 03/15/2017   HR HPV: +Detected  . ADHD   . Allergy    SEASONAL  . Anxiety   . Asthma    AS A CHILD  . Depression   . H/O concussion 1995    Past Surgical History:  Procedure Laterality Date  . COLPOSCOPY  2006  . WISDOM TOOTH EXTRACTION      Current Outpatient Prescriptions  Medication Sig Dispense Refill  . amphetamine-dextroamphetamine (ADDERALL) 10 MG tablet Take 10 mg by mouth daily.  0  . buPROPion (WELLBUTRIN XL) 150 MG 24 hr tablet Take 150 mg by mouth daily.  0  . FLUoxetine (PROZAC) 40 MG capsule Take 1 capsule by mouth daily.  0  . IRON PO Take by mouth daily.    Marland Kitchen VYVANSE 20 MG capsule Take 20 mg by mouth daily.  0   Current Facility-Administered Medications  Medication Dose Route Frequency Provider Last Rate Last Dose  . 0.9 %  sodium chloride infusion  500 mL Intravenous Continuous Nandigam, Eleonore Chiquito, MD         ALLERGIES: Patient has no known allergies.  Family History  Problem Relation Age of Onset  . ADD / ADHD Mother   . GER disease Mother   . ADD / ADHD Sister   . Depression Brother   . ADD / ADHD Brother    . Celiac disease Father   . Depression Father   . Depression Maternal Grandmother   . GER disease Maternal Grandfather   . Heart disease Maternal Grandfather   . Celiac disease Paternal Grandmother   . Kidney disease Paternal Grandmother   . Celiac disease Paternal Uncle   . Melanoma Paternal Aunt     Social History   Social History  . Marital status: Single    Spouse name: N/A  . Number of children: 0  . Years of education: N/A   Occupational History  . grad student/assistant    Social History Main Topics  . Smoking status: Never Smoker  . Smokeless tobacco: Never Used  . Alcohol use 1.2 oz/week    2 Glasses of wine per week     Comment: social  . Drug use: No  . Sexual activity: Yes    Birth control/ protection: IUD     Comment: ParaGard IUD 2013   Other Topics Concern  . Not on file   Social History Narrative  . No narrative  on file    Review of Systems  Constitutional: Negative.   HENT: Negative.   Eyes: Negative.   Respiratory: Negative.   Cardiovascular: Negative.   Gastrointestinal: Negative.   Genitourinary: Negative.   Musculoskeletal: Negative.   Skin: Negative.   Neurological: Negative.   Endo/Heme/Allergies: Negative.   Psychiatric/Behavioral: Negative.     PHYSICAL EXAMINATION:    BP 110/60 (BP Location: Right Arm, Patient Position: Sitting, Cuff Size: Normal)   Pulse 72   Resp 14   Wt 176 lb (79.8 kg)   LMP 04/29/2017   BMI 26.57 kg/m     General appearance: alert, cooperative and appears stated age  Pelvic: External genitalia:  no lesions              Urethra:  normal appearing urethra with no masses, tenderness or lesions              Bartholins and Skenes: normal                 Vagina: normal appearing vagina with normal color and discharge, no lesions              Cervix: no gross lesions, IUD string 3-4 cm. IUD removed with ringed forceps  Procedure: The patient was counseled as to the risks of the procedure, including:  infection, bleeding, future pregnancy risks and cervical stenosis. A consent form was signed.  Under colposcopic guidance, acetic acid and then Lugols solution was placed on the cervix and a paracervical block was injected using 1% lidocaine with epinephrine. Under colposcopic guidance, the 2 x 0.8 cm loop was used to remove a portion of the ectocervix taking care to get the entire transformation zone.  The settings were 55 cut, 50 coag with a blend of 1.  An ECC was performed. The cautery ball was then used to cauterize the base of the biopsy site and monsels were placed. The patient tolerated the procedure well.   Chaperone was present for exam.  ASSESSMENT CIN III Contraception, discussed the option of removing or leaving the IUD in place. Her cycles have been long with the paragard IUD    PLAN LEEP with ECC IUD removed, she will use condoms for now. Considering pregnancy in the next year, hasn't done well on hormones in the past We discussed the potential option of the [facility] IUD as an option in the future F/U in one month   An After Visit Summary was printed and given to the patient.   CC: Dr Claudell Kyle

## 2017-06-08 NOTE — Patient Instructions (Signed)
LEEP Post-procedure Instructions . Cramping is common.  You may take Ibuprofen, Aleve, or Tylenol for the cramping.  This should resolve within the next two to three days.   . You may have bright red spotting or blackish discharge for several days after your procedure.  The discharge occurs because of a topical solution used to stop bleeding at the biopsy site(s).  The dark discharge will lighten and then turn clear before completely resolving.  You will need to wear a mini pad during this time. . . Refrain from putting anything in the vagina until the bleeding and/or discharge COMPLETLEY stops (usually two to three weeks). o Avoid intercourse for 3-4 weeks . You need to call the office if you have any pelvic pain, fever, heavy bleeding, foul smelling vaginal discharge, or if you are concerned. . Shower or bathe as normal . You will be notified within one week of your biopsy results or we will discuss your results at your follow-up appointment . You will need to return for surveillance Pap smear(s) as advised by your physician.

## 2017-06-10 ENCOUNTER — Telehealth: Payer: Self-pay | Admitting: *Deleted

## 2017-06-10 NOTE — Telephone Encounter (Signed)
-----   Message from Romualdo BolkJill Evelyn Jertson, MD sent at 06/10/2017 11:56 AM EDT ----- Please inform the patient that her leep returned with high grade dysplasia, the margins and the ECC were negative for dysplasia.  F/U in one month, needs f/u pap and hpv in 1 year.

## 2017-06-10 NOTE — Telephone Encounter (Signed)
Left message to call regarding lab results -eh 

## 2017-06-21 NOTE — Telephone Encounter (Signed)
Spoke with patient and gave results. Patient is scheduled 06-07-17 for follow up. Patient in 08 recall -eh

## 2017-07-08 ENCOUNTER — Ambulatory Visit: Payer: Self-pay | Admitting: Obstetrics and Gynecology

## 2017-07-08 ENCOUNTER — Encounter: Payer: Self-pay | Admitting: Obstetrics and Gynecology

## 2017-07-27 ENCOUNTER — Telehealth: Payer: Self-pay | Admitting: Obstetrics and Gynecology

## 2017-07-27 ENCOUNTER — Ambulatory Visit: Payer: BLUE CROSS/BLUE SHIELD | Admitting: Obstetrics and Gynecology

## 2017-07-27 ENCOUNTER — Encounter: Payer: Self-pay | Admitting: Obstetrics and Gynecology

## 2017-07-27 NOTE — Telephone Encounter (Signed)
Patient dnka her 1 month LEEP follow up appointment today. I left a message for this patient to call and reschedule. [facility] policy followed.

## 2017-08-04 NOTE — Telephone Encounter (Signed)
Left another message for patient to call and reschedule her 1 month LEEP follow up.

## 2017-08-06 NOTE — Telephone Encounter (Signed)
2nd call to patient, okay to close encounter/

## 2017-08-09 ENCOUNTER — Encounter: Payer: Self-pay | Admitting: *Deleted

## 2017-08-09 NOTE — Telephone Encounter (Signed)
Letter printed -eh 

## 2017-08-09 NOTE — Telephone Encounter (Signed)
Please send her a letter to f/u and then close the encounter

## 2017-08-13 DIAGNOSIS — F3341 Major depressive disorder, recurrent, in partial remission: Secondary | ICD-10-CM | POA: Diagnosis not present

## 2017-08-24 ENCOUNTER — Encounter: Payer: Self-pay | Admitting: Obstetrics and Gynecology

## 2017-08-24 ENCOUNTER — Ambulatory Visit (INDEPENDENT_AMBULATORY_CARE_PROVIDER_SITE_OTHER): Payer: BLUE CROSS/BLUE SHIELD | Admitting: Obstetrics and Gynecology

## 2017-08-24 ENCOUNTER — Other Ambulatory Visit: Payer: Self-pay

## 2017-08-24 VITALS — BP 130/80 | HR 80 | Resp 14 | Wt 178.0 lb

## 2017-08-24 DIAGNOSIS — N898 Other specified noninflammatory disorders of vagina: Secondary | ICD-10-CM

## 2017-08-24 DIAGNOSIS — Z3169 Encounter for other general counseling and advice on procreation: Secondary | ICD-10-CM | POA: Diagnosis not present

## 2017-08-24 DIAGNOSIS — Z9889 Other specified postprocedural states: Secondary | ICD-10-CM

## 2017-08-24 NOTE — Progress Notes (Signed)
GYNECOLOGY  VISIT   HPI: 36 y.o.   Single  Caucasian  female   G1P0010 with Patient's last menstrual period was 08/16/2017.   here for LEEP follow up. 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 She had the paragard IUD removed at that visit. Her cycles have gotten better. No pain with intercourse. Using condoms for contraception. She just got married earlier this month. Considering pregnancy. She has questions about medications.  No family history of chromosomal or genetic abnormalities.   GYNECOLOGIC HISTORY: Patient's last menstrual period was 08/16/2017. Contraception:condoms Menopausal hormone therapy: none         OB History    Gravida Para Term Preterm AB Living   1       1     SAB TAB Ectopic Multiple Live Births     1               Patient Active Problem List   Diagnosis Date Noted  . Abnormal Pap smear of cervix     Past Medical History:  Diagnosis Date  . Abnormal Pap smear of cervix 03/15/2017   HR HPV: +Detected  . ADHD   . Allergy    SEASONAL  . Anxiety   . Asthma    AS A CHILD  . Depression   . H/O concussion 1995    Past Surgical History:  Procedure Laterality Date  . COLPOSCOPY  2006  . WISDOM TOOTH EXTRACTION      Current Outpatient Medications  Medication Sig Dispense Refill  . Armodafinil (NUVIGIL) 150 MG tablet Take 150 mg daily by mouth.    Marland Kitchen. buPROPion (WELLBUTRIN XL) 150 MG 24 hr tablet Take 150 mg by mouth daily.  0  . FLUoxetine (PROZAC) 40 MG capsule Take 1 capsule by mouth daily.  0  . IRON PO Take by mouth daily.     Current Facility-Administered Medications  Medication Dose Route Frequency Provider Last Rate Last Dose  . 0.9 %  sodium chloride infusion  500 mL Intravenous Continuous Nandigam, Eleonore ChiquitoKavitha V, MD         ALLERGIES: Patient has no known allergies.  Family History  Problem Relation Age of Onset  . ADD / ADHD Mother   . GER disease Mother   . ADD /  ADHD Sister   . Depression Brother   . ADD / ADHD Brother   . Celiac disease Father   . Depression Father   . Depression Maternal Grandmother   . GER disease Maternal Grandfather   . Heart disease Maternal Grandfather   . Celiac disease Paternal Grandmother   . Kidney disease Paternal Grandmother   . Celiac disease Paternal Uncle   . Melanoma Paternal Aunt     Social History   Socioeconomic History  . Marital status: Single    Spouse name: Not on file  . Number of children: 0  . Years of education: Not on file  . Highest education level: Not on file  Social Needs  . Financial resource strain: Not on file  . Food insecurity - worry: Not on file  . Food insecurity - inability: Not on file  . Transportation needs - medical: Not on file  . Transportation needs - non-medical: Not on file  Occupational History  . Occupation: grad student/assistant  Tobacco Use  . Smoking status: Never Smoker  . Smokeless tobacco: Never Used  Substance and Sexual Activity  .  Alcohol use: Yes    Alcohol/week: 1.2 oz    Types: 2 Glasses of wine per week    Comment: social  . Drug use: No  . Sexual activity: Yes    Birth control/protection: IUD    Comment: ParaGard IUD 2013  Other Topics Concern  . Not on file  Social History Narrative  . Not on file    Review of Systems  Constitutional: Negative.   HENT: Negative.   Eyes: Negative.   Respiratory: Negative.   Cardiovascular: Positive for palpitations.  Gastrointestinal:       Bloating  Genitourinary: Negative.   Skin: Negative.   Neurological: Positive for headaches.  Psychiatric/Behavioral: Positive for depression.    PHYSICAL EXAMINATION:    BP 130/80 (BP Location: Right Arm, Patient Position: Sitting, Cuff Size: Normal)   Pulse 80   Resp 14   Wt 178 lb (80.7 kg)   LMP 08/16/2017   BMI 26.87 kg/m     General appearance: alert, cooperative and appears stated age  Pelvic: External genitalia:  no lesions               Urethra:  normal appearing urethra with no masses, tenderness or lesions              Bartholins and Skenes: normal                 Vagina: normal appearing vagina with an increase in watery, white, frothy vaginal d/c.               Cervix: no lesions, healing well from the leep, appears slightly friable.   Chaperone was present for exam.  ASSESSMENT F/U LEEP Considering pregnancy Discussed her medications, SSRI's are the #1 recommended medication in pregnancy. She can continue her Prozac, discussed category 3. Reviewed risks with Wellbutrin, recommended she stop Nuvigil Vaginal d/c, desires evaluation    PLAN F/U pap and hpv in 6/19 She will f/u with psychiatry for medication management. Start PNV Discussed  Affirm sent    An After Visit Summary was printed and given to the patient.  ~15 minutes face to face time of which over 50% was spent in counseling.

## 2017-08-25 ENCOUNTER — Telehealth: Payer: Self-pay | Admitting: *Deleted

## 2017-08-25 LAB — VAGINITIS/VAGINOSIS, DNA PROBE
Candida Species: NEGATIVE
Gardnerella vaginalis: POSITIVE — AB
Trichomonas vaginosis: NEGATIVE

## 2017-08-25 NOTE — Telephone Encounter (Signed)
-----   Message from Romualdo BolkJill Evelyn Jertson, MD sent at 08/25/2017 12:46 PM EST ----- Please inform the patient that her vaginitis probe was + for BV and treat with flagyl (either oral or vaginal, her choice), no ETOH while on Flagyl.  Oral: Flagyl 500 mg BID x 7 days, or Vaginal: Metrogel, 1 applicator per vagina q day x 5 days.

## 2017-08-25 NOTE — Telephone Encounter (Signed)
Left message to call regarding results -eh 

## 2017-09-06 MED ORDER — METRONIDAZOLE 500 MG PO TABS: 500.0000 mg | ORAL_TABLET | Freq: Two times a day (BID) | ORAL | 0 refills | Status: DC

## 2017-09-06 NOTE — Telephone Encounter (Signed)
Spoke with patient and gave results. Sent in RX for Flagyl per patient request. Advised to avoid alcohol while taking. Patient voiced understanding.-eh

## 2017-09-10 DIAGNOSIS — F3341 Major depressive disorder, recurrent, in partial remission: Secondary | ICD-10-CM | POA: Diagnosis not present

## 2017-10-27 ENCOUNTER — Telehealth: Payer: Self-pay

## 2017-10-27 ENCOUNTER — Institutional Professional Consult (permissible substitution): Payer: BLUE CROSS/BLUE SHIELD | Admitting: Neurology

## 2017-10-27 NOTE — Telephone Encounter (Signed)
Pt did not show for their appt with Dr. Athar today.  

## 2017-11-02 ENCOUNTER — Encounter: Payer: Self-pay | Admitting: Neurology

## 2017-11-24 ENCOUNTER — Ambulatory Visit: Payer: BLUE CROSS/BLUE SHIELD | Admitting: Neurology

## 2017-11-24 ENCOUNTER — Encounter: Payer: Self-pay | Admitting: Neurology

## 2017-11-24 VITALS — BP 120/78 | HR 87 | Ht 69.0 in | Wt 177.0 lb

## 2017-11-24 DIAGNOSIS — R519 Headache, unspecified: Secondary | ICD-10-CM

## 2017-11-24 DIAGNOSIS — G4710 Hypersomnia, unspecified: Secondary | ICD-10-CM

## 2017-11-24 DIAGNOSIS — G478 Other sleep disorders: Secondary | ICD-10-CM

## 2017-11-24 DIAGNOSIS — R442 Other hallucinations: Secondary | ICD-10-CM

## 2017-11-24 DIAGNOSIS — R51 Headache: Secondary | ICD-10-CM

## 2017-11-24 DIAGNOSIS — R0683 Snoring: Secondary | ICD-10-CM | POA: Diagnosis not present

## 2017-11-24 DIAGNOSIS — F39 Unspecified mood [affective] disorder: Secondary | ICD-10-CM

## 2017-11-24 DIAGNOSIS — R400 Somnolence: Secondary | ICD-10-CM | POA: Diagnosis not present
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
## 2017-11-24 NOTE — Patient Instructions (Addendum)
Thank you for choosing [facility] for your sleep related care! It was nice to meet you today! I appreciate that you entrust me with your sleep related healthcare concerns. I hope, I was able to address at least some of your concerns today, and that I can help you feel reassured and also get better.    Here is what we discussed today and what we came up with as our plan for you:    Based on your symptoms and your exam I believe you are at low risk for obstructive sleep apnea or OSA, but for your longer standing history of sleepiness, I think we should check your sleep with extended sleep testing to rule out something like narcolepsy. We will look into your severe sleepiness with a nighttime sleep study, followed by a daytime nap study. In preparation for sleep study testing, you have to taper off of your Nuvigil, your Prozac and your Wellbutrin.   Do no drive when sleepy! Please keep your sleep schedule stable and do not add any additional medications or caffeine in your day to day routine, in preparation of the study. Do not take any narcotic pain medication. This and antidepressants, and stimulants and caffeine can interfere with your results.   Please call our sleep lab administrative assistant when you are ready to embark on sleep study testing, please discuss with Mr. Shawnee KnappShugart, when it may be a good time for years to come off of your medications as listed above, remember, you have to be off of your medications for at least 2 weeks prior to sleep study testing, which will include a night time sleep study and next day nap study.   Please feel free to call us at [phone]. You can leave a message with your phone number and concerns, if you get the voicemail box. She will call back as soon as possible.

## 2017-12-03 DIAGNOSIS — F3341 Major depressive disorder, recurrent, in partial remission: Secondary | ICD-10-CM | POA: Diagnosis not present

## 2018-01-19 ENCOUNTER — Encounter: Payer: Self-pay | Admitting: Certified Nurse Midwife

## 2018-01-19 ENCOUNTER — Other Ambulatory Visit: Payer: Self-pay

## 2018-01-19 ENCOUNTER — Ambulatory Visit: Payer: BLUE CROSS/BLUE SHIELD | Admitting: Certified Nurse Midwife

## 2018-01-19 VITALS — BP 110/80 | HR 68 | Resp 16 | Ht 68.25 in | Wt 172.0 lb

## 2018-01-19 DIAGNOSIS — N898 Other specified noninflammatory disorders of vagina: Secondary | ICD-10-CM | POA: Diagnosis not present

## 2018-01-19 NOTE — Progress Notes (Signed)
37 y.o. Married Caucasian female G1P0010 here with complaint of vaginal symptoms of itching, irritation,  and increase discharge. Describes discharge as grey with ?odor. Trying for pregnancy and so sexually active more frequently. Had recent positive Ovulation and has been sexually active several days consistently. Onset of symptoms 3-4 days ago. Denies new personal products, except for lubricant for trying for pregnancy. no STD concerns.  Denies urinary symptoms . Marland Kitchen. Contraception is nothing.  Review of Systems  Genitourinary:       Vaginal itching & irritation    O:Healthy female WDWN Affect: normal, orientation x 3  Exam:Skin: warm and dry Abdomen:soft, non tender, no masses Inguinal Lymph nodes: no enlargement or tenderness Pelvic exam: External genital: normal female, very slight increase pink, no scaling or exudate or lesions BUS: negative Vagina: grey creamy non odorous discharge noted., Affirm taken Cervix: normal, non tender, no CMT, posterior Uterus: normal, non tender Adnexa:normal, non tender, no masses or fullness noted  A:Normal pelvic exam R/O vaginal infection, history of BV Contraception none trying for pregnancy   P:Discussed findings of normal pelvic exam and discharge noted. Will wait for lab results and treat if indicated.  Discussed Aveeno  sitz bath for comfort. Avoid moist clothes  for extended period of time. Discussed taking prenatal vitamins daily and advise if positive pregnancy test and will be scheduled appointment to confirm and discuss OB care. Questions addressed.  Rv prn

## 2018-01-19 NOTE — Patient Instructions (Signed)

## 2018-01-20 ENCOUNTER — Other Ambulatory Visit: Payer: Self-pay

## 2018-01-20 LAB — VAGINITIS/VAGINOSIS, DNA PROBE
Candida Species: POSITIVE — AB
Gardnerella vaginalis: POSITIVE — AB
Trichomonas vaginosis: NEGATIVE

## 2018-01-20 NOTE — Telephone Encounter (Signed)
lmtcb

## 2018-01-20 NOTE — Telephone Encounter (Signed)
-----   Message from Verner Choleborah S Leonard, CNM sent at 01/20/2018  2:42 PM EDT ----- Notify patient that her vaginal screen showed positive for yeast and BV.  Monistat 7 OTC one applicator vaginally x 7 days for yeast and Rx Flagyl 500 mg bid x 5 days She is trying for pregnancy( verified recommendations in up to date)

## 2018-01-21 MED ORDER — METRONIDAZOLE 500 MG PO TABS: 500.0000 mg | ORAL_TABLET | Freq: Two times a day (BID) | ORAL | 0 refills | Status: DC

## 2018-02-02 ENCOUNTER — Telehealth: Payer: Self-pay

## 2018-02-02 ENCOUNTER — Telehealth: Payer: Self-pay | Admitting: Obstetrics and Gynecology

## 2018-02-02 DIAGNOSIS — G4710 Hypersomnia, unspecified: Secondary | ICD-10-CM

## 2018-02-02 NOTE — Telephone Encounter (Signed)
Pt is ready to schedule NPSG and MSLT. Need orders for both studies please.

## 2018-02-02 NOTE — Telephone Encounter (Signed)
Spoke to Dr. Frances FurbishAthar, VO received for NPSG and MSLT. Orders placed.

## 2018-02-02 NOTE — Telephone Encounter (Signed)
Returned patient's call and left message for her to call me back.

## 2018-02-02 NOTE — Telephone Encounter (Signed)
Patient called with questions about seeing a fertility specialist.

## 2018-02-02 NOTE — Addendum Note (Signed)
Addended by: Geronimo RunningINKINS, Aliou Mealey A on: 02/02/2018 05:07 PM   Modules accepted: Orders

## 2018-02-04 DIAGNOSIS — F3341 Major depressive disorder, recurrent, in partial remission: Secondary | ICD-10-CM | POA: Diagnosis not present

## 2018-02-08 NOTE — Telephone Encounter (Signed)
Left message to call Djibril Glogowski at [phone]. 

## 2018-02-14 NOTE — Telephone Encounter (Signed)
She isn't consider to have infertility until she goes a year without protection and no pregnancy. Once she turns 37 that goes down to 6 months. I would recommend she come in to discuss optimizing her chances and we can start some basic blood work to check her ovarian reserve.

## 2018-02-14 NOTE — Telephone Encounter (Signed)
Patient has been trying for pregnancy for 6 months. She has had same partner for years. She has conceived once before. Patient is getting close to 37y.o.and becoming concerned she's not getting any younger and wants to know if there is anything else Dr.Jertson can offer her here or should we refer her to a specialist? Will discuss with Dr.Jertson and give her a call back.

## 2018-02-17 NOTE — Telephone Encounter (Signed)
Called patient and left message for her to return call to office.

## 2018-02-22 NOTE — Telephone Encounter (Signed)
Called patient and per DPR, left detailed message stating she should make an appointment to see Dr.Jertson to discuss optimizing her chances for pregnancy and we can start with some basic blood work. Advised she could call office to discuss and make appointment.

## 2018-02-24 NOTE — Telephone Encounter (Signed)
Patient has not returned call to the office x 2. Detailed message left for patient. Encounter closed.

## 2018-03-14 NOTE — Progress Notes (Signed)
GYNECOLOGY  VISIT   HPI: 37 y.o.   Married  Caucasian  female   [id number] with Patient's last menstrual period was 02/28/2018.   here for consult. Patient has been trying for pregnancy 6-7 months. Patient has recently stopped all medications because she will be having a sleep study. Cycles had been q 33 days until March she had a 36 day cycle, then a 26 day cycle. Bleeds x 4 days. Can saturate a regular tampon in 6-8 hours. Mild cramping. She reports mild moliminal symptoms.  She does c/o mild fatigue. She is off of her antidepressants, more fatigued, not feeling depressed, just very tired. Sleep study next week.  No constipation, no dry skin. No galactorrhea.  She is trying to time intercourse, she is sometimes seeing ovulation on predictor kits. Having sex every 1-2 days during her fertile window. On kits she typically ovulates on cycle day 21 or 22.  Husband is [age], healthy, non smoker. They had one pregnancy 6 years ago together, had a TAB.  H/O chlamydia in college.   GYNECOLOGIC HISTORY: Patient's last menstrual period was 02/28/2018. Contraception:None Menopausal hormone therapy: n/a        OB History    Gravida  1   Para      Term      Preterm      AB  1   Living        SAB      TAB  1   Ectopic      Multiple      Live Births                 Patient Active Problem List   Diagnosis Date Noted  . Abnormal Pap smear of cervix     Past Medical History:  Diagnosis Date  . Abnormal Pap smear of cervix 03/15/2017   HR HPV: +Detected  . ADHD   . Allergy    SEASONAL  . Anxiety   . Asthma    AS A CHILD  . Depression   . H/O concussion 1995    Past Surgical History:  Procedure Laterality Date  . COLPOSCOPY  2006  . WISDOM TOOTH EXTRACTION      Current Outpatient Medications  Medication Sig Dispense Refill  . Prenatal Vit-Fe Fumarate-FA (PRENATAL PO) Take by mouth daily.    . Armodafinil 150 MG tablet TK 1 T PO  DAILY  3  . buPROPion (WELLBUTRIN XL)  150 MG 24 hr tablet Take 150 mg by mouth daily.  0  . FLUoxetine (PROZAC) 20 MG capsule Take 20 mg by mouth daily.    Marland Kitchen. FLUoxetine (PROZAC) 40 MG capsule Take 1 capsule by mouth daily.  0   Current Facility-Administered Medications  Medication Dose Route Frequency Provider Last Rate Last Dose  . 0.9 %  sodium chloride infusion  500 mL Intravenous Continuous Nandigam, Eleonore ChiquitoKavitha V, MD         ALLERGIES: Patient has no known allergies.  Family History  Problem Relation Age of Onset  . ADD / ADHD Mother   . GER disease Mother   . ADD / ADHD Sister   . Depression Brother   . ADD / ADHD Brother   . Celiac disease Father   . Depression Father   . Depression Maternal Grandmother   . GER disease Maternal Grandfather   . Heart disease Maternal Grandfather   . Celiac disease Paternal Grandmother   . Kidney disease Paternal Grandmother   . Celiac  disease Paternal Uncle   . Melanoma Paternal Aunt     Social History   Socioeconomic History  . Marital status: Married    Spouse name: Not on file  . Number of children: 0  . Years of education: Not on file  . Highest education level: Not on file  Occupational History  . Occupation: grad student/assistant  Social Needs  . Financial resource strain: Not on file  . Food insecurity:    Worry: Not on file    Inability: Not on file  . Transportation needs:    Medical: Not on file    Non-medical: Not on file  Tobacco Use  . Smoking status: Never Smoker  . Smokeless tobacco: Never Used  Substance and Sexual Activity  . Alcohol use: Yes    Alcohol/week: 1.2 oz    Types: 2 Standard drinks or equivalent per week    Comment: social  . Drug use: No  . Sexual activity: Yes    Partners: Male    Birth control/protection: None  Lifestyle  . Physical activity:    Days per week: Not on file    Minutes per session: Not on file  . Stress: Not on file  Relationships  . Social connections:    Talks on phone: Not on file    Gets together: Not  on file    Attends religious service: Not on file    Active member of club or organization: Not on file    Attends meetings of clubs or organizations: Not on file    Relationship status: Not on file  . Intimate partner violence:    Fear of current or ex partner: Not on file    Emotionally abused: Not on file    Physically abused: Not on file    Forced sexual activity: Not on file  Other Topics Concern  . Not on file  Social History Narrative  . Not on file    Review of Systems  Constitutional: Negative.   HENT: Negative.   Eyes: Negative.   Respiratory: Negative.   Cardiovascular: Negative.   Gastrointestinal: Negative.   Genitourinary: Negative.   Musculoskeletal: Negative.   Skin: Negative.   Neurological:       Difficulty with memory--patient feels this is due to coming off meds.  Endo/Heme/Allergies: Bruises/bleeds easily.       Swollen glands in neck and underarm  Psychiatric/Behavioral: Negative.     PHYSICAL EXAMINATION:    BP 104/64 (BP Location: Right Arm, Patient Position: Sitting, Cuff Size: Normal)   Pulse 70   Ht 5' 8.25" (1.734 m)   Wt 175 lb 9.6 oz (79.7 kg)   LMP 02/28/2018   BMI 26.50 kg/m     General appearance: alert, cooperative and appears stated age  ASSESSMENT Attempting pregnancy x 7 months, has done Ovulation predictor kits, has only seen ovulation 3 out of 6 months, but isn't always consistent in checking.  Has tried BBT charts, but not consistently H/O chlamydia    PLAN Return for cycle day #3 FSH, estradiol, AMH, TSH, prolactin She has tried doing BBT charts, sleep has been off and she has been forgetting. Will try to do the BBT charts (has an app) Continue with OPK Plan HSG after her cycle If still not pregnant by 9/19 would do semen analysis   F/U next month for an annual exam and f/u  An After Visit Summary was printed and given to the patient.  ~15 minutes face to face time of which  over 50% was spent in counseling.

## 2018-03-16 ENCOUNTER — Encounter: Payer: Self-pay | Admitting: Obstetrics and Gynecology

## 2018-03-16 ENCOUNTER — Ambulatory Visit: Payer: BLUE CROSS/BLUE SHIELD | Admitting: Obstetrics and Gynecology

## 2018-03-16 VITALS — BP 104/64 | HR 70 | Ht 68.25 in | Wt 175.6 lb

## 2018-03-16 DIAGNOSIS — Z3169 Encounter for other general counseling and advice on procreation: Secondary | ICD-10-CM

## 2018-03-16 DIAGNOSIS — Z8619 Personal history of other infectious and parasitic diseases: Secondary | ICD-10-CM | POA: Diagnosis not present

## 2018-03-16 DIAGNOSIS — N926 Irregular menstruation, unspecified: Secondary | ICD-10-CM | POA: Diagnosis not present

## 2018-03-16 NOTE — Patient Instructions (Signed)
Call on the first 1-3 days of your cycle to set up cycle day #3 lab work and an HSG.  Hysterosalpingography Hysterosalpingography is a procedure to look inside your uterus and fallopian tubes. During this procedure, contrast dye is injected into your uterus through your vagina and cervix to illuminate your uterus while X-ray pictures are taken. This procedure may help your health care provider determine whether you have uterine tumors, adhesions, or structural abnormalities. It is commonly used to help determine why a woman is unable to have children (infertility). The procedure usually lasts about 15-30 minutes. LET [facility] PROVID[facility] KNOW ABOUT:  Any allergies you have.  All medicines you are taking, including vitamins, herbs, eye drops, creams, and over-the-counter medicines.  Previous problems you or members of your family have had with the use of anesthetics.  Any blood disorders you have.  Previous surgeries you have had.  Medical conditions you have. RISKS AND COMPLICATIONS Generally, this is a safe procedure. However, as with any procedure, problems can occur. Possible problems include:  Infection in the lining of the uterus (endometritis) or fallopian tubes (salpingitis).  Damage or perforation of the uterus or fallopian tubes.  An allergic reaction to the contrast dye used to perform the X-ray.  BEFORE THE PROCEDURE  Schedule the procedure after your period stops, but before your next ovulation. This is usually between day 5 and day 10 of your last period. Day 1 is the first day of your period.  Ask your health care provider about changing or stopping your regular medicines.  You may eat and drink as normal.  Empty your bladder before the procedure begins. PROCEDURE  You may be given a medicine to relax you (sedative) or an over-the-counter pain medicine to lessen any discomfort during the procedure.  You will lie down on an X-ray table with your feet in  stirrups.  A device called a speculum will be placed into your vagina. This allows your health care provider to see inside your vagina to the cervix.  The cervix will be washed with a special soap.  A thin, flexible tube will be passed through the cervix into the uterus.  Contrast dye will be put into this tube.  Several X-rays will be taken as the contrast dye spreads through the uterus and fallopian tubes.  The tube will be taken out after the procedure. What to expect after the procedure  Most of the contrast dye will flow out of your vagina naturally. You may want to wear a sanitary pad.  You may feel mild cramping and notice a little bleeding from your vagina. This should go away in 24 hours.  Ask when your test results will be ready. Make sure you get your test results. This information is not intended to replace advice given to you by your health care provider. Make sure you discuss any questions you have with your health care provider. Document Released: 10/31/2004 Document Revised: 03/05/2016 Document Reviewed: 03/31/2013 Elsevier Interactive Patient Education  2017 [employer]

## 2018-03-21 ENCOUNTER — Ambulatory Visit (INDEPENDENT_AMBULATORY_CARE_PROVIDER_SITE_OTHER): Payer: BLUE CROSS/BLUE SHIELD | Admitting: Neurology

## 2018-03-21 DIAGNOSIS — G4710 Hypersomnia, unspecified: Secondary | ICD-10-CM

## 2018-03-21 DIAGNOSIS — G4720 Circadian rhythm sleep disorder, unspecified type: Secondary | ICD-10-CM

## 2018-03-21 DIAGNOSIS — G4733 Obstructive sleep apnea (adult) (pediatric): Secondary | ICD-10-CM

## 2018-03-21 DIAGNOSIS — G4711 Idiopathic hypersomnia with long sleep time: Secondary | ICD-10-CM

## 2018-03-21 DIAGNOSIS — R0683 Snoring: Secondary | ICD-10-CM

## 2018-03-22 ENCOUNTER — Encounter (INDEPENDENT_AMBULATORY_CARE_PROVIDER_SITE_OTHER): Payer: BLUE CROSS/BLUE SHIELD | Admitting: Neurology

## 2018-03-22 DIAGNOSIS — R442 Other hallucinations: Secondary | ICD-10-CM | POA: Diagnosis not present

## 2018-03-22 DIAGNOSIS — G4752 REM sleep behavior disorder: Secondary | ICD-10-CM

## 2018-03-22 DIAGNOSIS — R400 Somnolence: Secondary | ICD-10-CM | POA: Diagnosis not present

## 2018-03-22 DIAGNOSIS — G4710 Hypersomnia, unspecified: Secondary | ICD-10-CM

## 2018-03-22 NOTE — Addendum Note (Signed)
Addended by: Tamera StandsHINNANT, Faline Langer D on: 03/22/2018 01:50 PM   Modules accepted: Orders

## 2018-03-22 NOTE — Addendum Note (Signed)
Addended by: Geronimo RunningINKINS, Natanel Snavely A on: 03/22/2018 01:27 PM   Modules accepted: Orders

## 2018-03-23 ENCOUNTER — Telehealth: Payer: Self-pay

## 2018-03-23 NOTE — Procedures (Signed)
HISTORY: 37 year old woman with a history of seasonal allergies, ADHD, anxiety, depression, asthma as a child and borderline overweight state, who reports significant daytime somnolence since her 3s. The patient endorsed the Epworth Sleepiness Scale at 15 points. The patient's weight 177 pounds with a height of 69 (inches), resulting in a BMI of 26.1 kg/m2. The patient's neck circumference measured 13 inches. Patient tapered off her meds for these sleep studies: overnight sleep study and next day nap study. She had a UDS during the MSLT on 03/22/18.  Name:  Katrina Phillips, Katrina Phillips Reference [id number]  Study Date: 03/22/2018 Procedure #: [id number]  DOB: Oct 01, 1981    Protocol  This is a 13 channel Multiple Sleep Latency Test comprised of 5 channels of EEG (T3-Cz, Cz-T4, F4-M1, C4-M1, O2-M1), 3 channels of Chin EMG, 4 channels of EOG and 1 channel for ECG.   All channels were sampled at 256hz .    This polysomnographic procedure is designed to evaluate (1) the complaint of excessive daytime sleepiness by quantifying the time required to fall asleep and (2) the possibility of narcolepsy by checking for abnormally short latencies to REM sleep.  Electrographic variables include EEG, EMG, EOG and ECG.  Patients are monitored throughout four or five 20-minute opportunities to sleep (naps) at two-hour intervals.  For each nap, the patient is allowed 20 minutes to fall asleep.  Once asleep, the patient is awakened after 15 minutes.  Between naps, the patient is kept as alert as possible.  A sleep latency of 20 minutes indicates that no sleep occurred.  Parametric Analysis  Total Number of Naps 5     NAP # Time of Nap  Sleep Latency (mins) REM Latency (mins) Sleep Time Percent Awake Time Percent  1 07:30 8.5 0 60 40   2 09:26 2.5 0 87  13   3 11:27 2 0 79  21   4 13:22 3.5 0 81  19   5 15:17 2 0 79  21    MSLT Summary of Naps  Sleepiness Index: 81.5  Mean Sleep Latency to all Five Naps: 3.7  Mean Sleep  Latency to First Four Naps: 4.1  Mean Sleep Latency to First Three Naps: 4.3  Mean Sleep Latency to First Two Naps: 5.5  Number of Naps with REM Sleep: 0    Results from Preceding PSG Study  Sleep Onset Time 21:35 Sleep Efficiency (%) 90.6  Rise Time 06:06 Sleep Latency (min) 34.5  Total Sleep Time  487.5 REM Latency (min) 88    I attest to having reviewed every epoch of the entire raw data recording prior to the issuance of this report in accordance with the Standards of the American Academy of Sleep Medicine.    IMPRESSION: This multiple sleep latency test reveals a mean sleep latency of 3.7 minutes with 0 sleep periods during which REM sleep was recorded.  A total of 5 sleep periods were recorded. This study was preceded by an overnight polysomnogram with a total sleep time (TST) of 487.5 minutes.       Name:  Katrina Phillips, Katrina Phillips Reference #:  [id number]  Study Date: 03/22/2018 DOB: 02-21-1981    IMPRESSION:  1. Idiopathic Hypersomnolence 2. Primary Snoring 3. Dysfunctions associated with sleep stages or arousal from sleep  RECOMMENDATIONS:  1. The overnight sleep study and next day nap study indicate no significantly sleep disordered breathing, except for mild intermittent snoring. The low mean sleep latency (3.7 min) and 0 SOREMPs (sleep onset REM periods) for the nap study  indicate severe sleepiness, but not narcolepsy. These findings, along with the patients clinical history support the diagnosis of idiopathic hypersomnolence. Treatment options should be discussed with the patient. The patient will be advised to resume her medication regimen.  2. The patient should be cautioned not to drive, work at heights, or operate dangerous or heavy equipment when tired or sleepy. Review and reiteration of good sleep hygiene measures should be pursued with any patient. 3. This study shows sleep fragmentation and abnormal sleep stage percentages; these are nonspecific findings and per se do not  signify an intrinsic sleep disorder or a cause for the patient's sleep-related symptoms. Causes include (but are not limited to) the first night effect of the sleep study, circadian rhythm disturbances, medication effect or an underlying mood disorder or medical problem.  4. The patient will be [facility] for discussion of the test results, symptom and treatment compliance review, further management strategies, etc. The referring provider will be notified of the test results.  I certify that I have reviewed the entire raw data recording prior to the issuance of this report in accordance with the Standards of Accreditation of the American Academy of Sleep Medicine (AASM)  Huston FoleySaima Drayke Grabel, MD, PhD Diplomat, American Board of Neurology and Sleep Medicine (Neurology and Sleep Medicine)

## 2018-03-23 NOTE — Progress Notes (Signed)
Patient referred by her psychiatry provider, seen by me on 11/24/17, diagnostic PSG on 03/21/18, next day MSLT on 03/22/18.   Please call and notify the patient that the recent sleep study showed no significant obstructive sleep apnea, some mild snoring, no sign PLMs. She was noted to be very sleep during the nap study and slept in all 5 naps with a mean latency of only 3.7 min. This indicates likely Dx of idiopathic hypersomnolence. She can resume all her meds. Please arrange FU appt to discuss further.   Huston FoleySaima Odell Choung, MD, PhD [facility])

## 2018-03-23 NOTE — Telephone Encounter (Signed)
-----   Message from Huston FoleySaima Athar, MD sent at 03/23/2018  8:56 AM EDT ----- Patient referred by her psychiatry provider, seen by me on 11/24/17, diagnostic PSG on 03/21/18, next day MSLT on 03/22/18.   Please call and notify the patient that the recent sleep study showed no significant obstructive sleep apnea, some mild snoring, no sign PLMs. She was noted to be very sleep during the nap study and slept in all 5 naps with a mean latency of only 3.7 min. This indicates likely Dx of idiopathic hypersomnolence. She can resume all her meds. Please arrange FU appt to discuss further.   Huston FoleySaima Athar, MD, PhD [facility])

## 2018-03-23 NOTE — Procedures (Signed)
PATIENT'S NAME:  Katrina Phillips, Katrina Phillips DOB:      1980/10/15      MR#:    [id number]     DATE OF RECORDING: 03/21/2018 REFERRING M.D.:  Anne Fulay Shugart PA-C Study Performed:   Baseline Polysomnogram HISTORY: 37 year old woman with a history of seasonal allergies, ADHD, anxiety, depression, asthma as a child and borderline overweight state, who reports significant daytime somnolence since her 4520s. The patient endorsed the Epworth Sleepiness Scale at 15 points. The patient's weight 177 pounds with a height of 69 (inches), resulting in a BMI of 26.1 kg/m2. The patient's neck circumference measured 13 inches. Patient tapered off her meds for these sleep studies: overnight sleep study and next day nap study. She had a UDS during the MSLT on 03/22/18.  CURRENT MEDICATIONS: Armodafinil, Wellbutrin, Prozac   PROCEDURE:  This is a multichannel digital polysomnogram utilizing the Somnostar 11.2 system.  Electrodes and sensors were applied and monitored per AASM Specifications.   EEG, EOG, Chin and Limb EMG, were sampled at 200 Hz.  ECG, Snore and Nasal Pressure, Thermal Airflow, Respiratory Effort, CPAP Flow and Pressure, Oximetry was sampled at 50 Hz. Digital video and audio were recorded.      BASELINE STUDY  Lights Out was at 21:09 and Lights On at 06:06.  Total recording time (TRT) was 538 minutes, with a total sleep time (TST) of  487.5 minutes.   The patient's sleep latency was 34.5 minutes, which is delayed. REM latency was 88 minutes, which is normal. The sleep efficiency was 90.6%.     SLEEP ARCHITECTURE: WASO (Wake after sleep onset) was 24.5 minutes with mild sleep fragmentation noted. There were 30.5 minutes in Stage N1, 173.5 minutes Stage N2, 178 minutes Stage N3 and 105.5 minutes in Stage REM.  The percentage of Stage N1 was 6.3%, Stage N2 was 35.6%, Stage N3 was 36.5%, which is increased, and Stage R (REM sleep) was 21.6%, which is normal. The arousals were noted as: 84 were spontaneous, 14 were associated  with PLMs, 1 were associated with respiratory events.  RESPIRATORY ANALYSIS:  There were a total of 8 respiratory events:  0 obstructive apneas, 0 central apneas and 0 mixed apneas with a total of 0 apneas and an apnea index (AI) of 0 /hour. There were 8 hypopneas with a hypopnea index of 1. /hour. The patient also had 0 respiratory event related arousals (RERAs).      The total APNEA/HYPOPNEA INDEX (AHI) was 1/hour and the total RESPIRATORY DISTURBANCE INDEX was 0. 1. /hour.  7 events occurred in REM sleep and 2 events in NREM. The REM AHI was 0. 4. /hour, versus a non-REM AHI of .2. The patient spent 121.5 minutes of total sleep time in the supine position and 366 minutes in non-supine.. The supine AHI was 0.5 versus a non-supine AHI of 1.1.  OXYGEN SATURATION & C02:  The Wake baseline 02 saturation was 97%, with the lowest being 87%. Time spent below 89% saturation equaled 3 minutes.  PERIODIC LIMB MOVEMENTS: The patient had a total of 74 Periodic Limb Movements.  The Periodic Limb Movement (PLM) index was 9.1 and the PLM Arousal index was 1.7/hour.  Audio and video analysis did not show any abnormal or unusual movements, behaviors, phonations or vocalizations. The patient took no bathroom breaks. Soft intermittent snoring was noted. The EKG was in keeping with normal sinus rhythm (NSR).   Post-study, the patient indicated that sleep was worse than usual.   The patient had a nap study,  MSLT, next day on 03/22/18 with a mean sleep latency of 3.7 minutes for 5 naps and 0 SOREMPs (sleep onset REM periods) noted, arguing against narcolepsy but indicating significant sleepiness.    IMPRESSION:  1. Idiopathic Hypersomnolence 2. Primary Snoring 3. Dysfunctions associated with sleep stages or arousal from sleep  RECOMMENDATIONS:  1. The overnight sleep study and next day nap study indicate no significantly sleep disordered breathing, except for mild intermittent snoring. The low mean sleep latency  (3.7 min) and 0 SOREMPs (sleep onset REM periods) for the nap study indicate severe sleepiness, but not narcolepsy. These findings, along with the patients clinical history support the diagnosis of idiopathic hypersomnolence. Treatment options should be discussed with the patient. The patient will be advised to resume her medication regimen.  2. The patient should be cautioned not to drive, work at heights, or operate dangerous or heavy equipment when tired or sleepy. Review and reiteration of good sleep hygiene measures should be pursued with any patient. 3. This study shows sleep fragmentation and abnormal sleep stage percentages; these are nonspecific findings and per se do not signify an intrinsic sleep disorder or a cause for the patient's sleep-related symptoms. Causes include (but are not limited to) the first night effect of the sleep study, circadian rhythm disturbances, medication effect or an underlying mood disorder or medical problem.  4. The patient will be seen in follow-up in the sleep clinic at [facility] for discussion of the test results, symptom and treatment compliance review, further management strategies, etc. The referring provider will be notified of the test results.  I certify that I have reviewed the entire raw data recording prior to the issuance of this report in accordance with the Standards of Accreditation of the American Academy of Sleep Medicine (AASM)  Huston Foley, MD, PhD Diplomat, American Board of Neurology and Sleep Medicine (Neurology and Sleep Medicine)

## 2018-03-23 NOTE — Telephone Encounter (Signed)
I called pt to discuss her sleep study results. No answer, left a message asking her to call me back. 

## 2018-03-24 NOTE — Telephone Encounter (Signed)
I called pt again to discuss her sleep study results. No answer, left a message asking her to call me back. 

## 2018-03-28 LAB — COMPREHENSIVE DRUG ANALYSIS,UR

## 2018-03-28 NOTE — Telephone Encounter (Signed)
I called pt again to discuss her sleep study results, no answer, left a message asking her to call me back. This is my 3rd unsuccessful attempt at reaching pt by phone. Will send her a letter asking her to call me back.

## 2018-03-29 NOTE — Progress Notes (Signed)
Her antidepressant and its metabolite showed up in the UDS during MSLT; she may not have stopped it long enough before sleep testing? Please inquire. Also Ibuprofen in UDS, which is ok.  Janene Harveysa

## 2018-03-29 NOTE — Telephone Encounter (Signed)
Pt returned my call. I explained her sleep study results and her UDS results. ("Her antidepressant and its metabolite showed up in the UDS during MSLT; she may not have stopped it long enough before sleep testing? Please inquire. Also Ibuprofen in UDS, which is ok. sa")   Pt reports that she tapered off of fluoxetine for 2 weeks and then was completely off of fluoxetine for 2 weeks prior to the study. I advised her that she can resume her medications as directed by her prescribing physicians, who directed her on how to taper the meds. Pt is agreeable to an appt on 03/31/18 at 1:00pm with Dr. Frances FurbishAthar. Pt verbalized understanding of results. Pt had no questions at this time but was encouraged to call back if questions arise.

## 2018-03-31 ENCOUNTER — Encounter: Payer: Self-pay | Admitting: Neurology

## 2018-03-31 ENCOUNTER — Ambulatory Visit: Payer: BLUE CROSS/BLUE SHIELD | Admitting: Neurology

## 2018-04-04 ENCOUNTER — Telehealth: Payer: Self-pay | Admitting: Neurology

## 2018-04-04 NOTE — Telephone Encounter (Signed)
Per chart, Baxter HireKristen spoke to the patient on 03/23/18 concerning her sleep study and lab results.  She scheduled a follow up on 03/31/18, during this phone call.  The patient did not arrive to our office for her 03/31/18 appt.  She needs to contact our office and we will be happy to reschedule. The no show policy will need to be reviewed when she calls back.

## 2018-04-04 NOTE — Telephone Encounter (Signed)
Pt is asking for a call back(at [phone]) she states she was not given an exact date as to when to schedule her Sleep study f/u appointment.  Pt is asking for a call as to when she needs a f/u appointment.

## 2018-04-07 ENCOUNTER — Telehealth: Payer: Self-pay | Admitting: Obstetrics and Gynecology

## 2018-04-07 DIAGNOSIS — N915 Oligomenorrhea, unspecified: Secondary | ICD-10-CM

## 2018-04-07 DIAGNOSIS — N979 Female infertility, unspecified: Secondary | ICD-10-CM

## 2018-04-07 MED ORDER — DOXYCYCLINE HYCLATE 100 MG PO CAPS: 100.0000 mg | ORAL_CAPSULE | Freq: Two times a day (BID) | ORAL | 0 refills | Status: AC

## 2018-04-07 NOTE — Telephone Encounter (Signed)
Future lab orders released.   Encounter closed.

## 2018-04-07 NOTE — Telephone Encounter (Signed)
I agree, orders signed.

## 2018-04-07 NOTE — Telephone Encounter (Signed)
Patient called requesting to schedule lab work. Stated that it needed to be done on Day #3 of menes. Started menes today. Day #3 lands on Sunday, 04/10/18.

## 2018-04-07 NOTE — Telephone Encounter (Signed)
Spoke with patient. LMP 04/07/18. Calling to schedule day 3 labs and HSG. Reviewed OV note dated 03/16/18. Advised patient labs to be drawn on 6/29, can be done at [facility] [place] location 8-12, patient agreeable. Contact info provided. Advised no intercourse or vigorous exercise 24hrs prior, go early morning. [facility], will call to schedule and return call. Advised doxycycline 100mg  bid x5 days, start first dose morning of procedure. Patient verbalizes understanding and is agreeable.   1. FSH, estradiol, AMH, TSH & prolactin orders entered for provider to review.   2. [facility]. HSG scheduled for 04/13/18 at 2pm. Order placed.   Call returned to patient and notified of HSG appt. Patient agreeable.   Dr. Oscar LaJertson -please review pended labs and RX, ok to proceed?

## 2018-04-11 ENCOUNTER — Telehealth: Payer: Self-pay | Admitting: Obstetrics and Gynecology

## 2018-04-11 NOTE — Telephone Encounter (Signed)
Spoke with patient, advised as seen below per Dr. Oscar LaJertson. Patient will proceed with Hsg as scheduled, will return call on first day of next menses to schedule labs. Advised patient our office is working to confirm [facility] hours and availability for future labs on Saturday should this occur again. Patient verbalizes understanding and is agreeable.   Encounter closed.

## 2018-04-11 NOTE — Telephone Encounter (Addendum)
Spoke with patient. [facility] [place] [facility] and there was a note on the door that read no Saturday labs as of 04/04/18. Patient is scheduled for HSg on 04/13/18. Apologies to patient, advised will review with Dr. Oscar LaJertson and return call to advise on how to proceed. Patient verbalizes understanding and is agreeable.   Dr. Oscar LaJertson -Patient was to have day 3 FSH with estradiol, AMH, TSH and prolactin. Please advise on how to proceed with labs and HSG, schedule with next menses?

## 2018-04-11 NOTE — Telephone Encounter (Signed)
[facility]ika at [facility] Confirmed [facility] [place] location does draw labs on Saturday 8a -12pm.

## 2018-04-11 NOTE — Telephone Encounter (Signed)
Patient went to have [facility] on Saturday and was told that they no longer draw labs on Saturdays. Patient asked if she could have her labs drawn here or does she need to wait until next month.?

## 2018-04-11 NOTE — Telephone Encounter (Signed)
Yes, she should proceed with the hsg this cycle. Would recommend checking the labs with her next cycle. Please figure out where she can have them done on a weekend if this happens again. Thanks

## 2018-04-12 DIAGNOSIS — F3341 Major depressive disorder, recurrent, in partial remission: Secondary | ICD-10-CM | POA: Diagnosis not present

## 2018-04-13 ENCOUNTER — Ambulatory Visit (INDEPENDENT_AMBULATORY_CARE_PROVIDER_SITE_OTHER): Payer: BLUE CROSS/BLUE SHIELD | Admitting: Family Medicine

## 2018-04-13 ENCOUNTER — Ambulatory Visit (HOSPITAL_COMMUNITY)
Admission: RE | Admit: 2018-04-13 | Discharge: 2018-04-13 | Disposition: A | Discharge status: Home / Self Care | Payer: BLUE CROSS/BLUE SHIELD | Source: Ambulatory Visit | Attending: Obstetrics and Gynecology | Admitting: Obstetrics and Gynecology

## 2018-04-13 ENCOUNTER — Telehealth: Payer: Self-pay

## 2018-04-13 ENCOUNTER — Encounter: Payer: Self-pay | Admitting: Family Medicine

## 2018-04-13 VITALS — BP 104/82 | HR 79 | Temp 98.6°F | Ht 69.0 in | Wt 177.2 lb

## 2018-04-13 DIAGNOSIS — R5382 Chronic fatigue, unspecified: Secondary | ICD-10-CM

## 2018-04-13 DIAGNOSIS — N979 Female infertility, unspecified: Secondary | ICD-10-CM | POA: Insufficient documentation

## 2018-04-13 DIAGNOSIS — R5383 Other fatigue: Secondary | ICD-10-CM | POA: Insufficient documentation

## 2018-04-13 DIAGNOSIS — F418 Other specified anxiety disorders: Secondary | ICD-10-CM

## 2018-04-13 LAB — CBC
HCT: 40.1 % (ref 36.0–46.0)
Hemoglobin: 13.5 g/dL (ref 12.0–15.0)
MCHC: 33.6 g/dL (ref 30.0–36.0)
MCV: 96.2 fl (ref 78.0–100.0)
Platelets: 296 10*3/uL (ref 150.0–400.0)
RBC: 4.17 Mil/uL (ref 3.87–5.11)
RDW: 12.9 % (ref 11.5–15.5)
WBC: 5 10*3/uL (ref 4.0–10.5)

## 2018-04-13 LAB — BASIC METABOLIC PANEL
BUN: 10 mg/dL (ref 6–23)
CO2: 30 mEq/L (ref 19–32)
Calcium: 9 mg/dL (ref 8.4–10.5)
Chloride: 104 mEq/L (ref 96–112)
Creatinine, Ser: 0.69 mg/dL (ref 0.40–1.20)
GFR: 103.02 mL/min (ref >60.00)
Glucose, Bld: 100 mg/dL — ABNORMAL HIGH (ref 70–99)
Potassium: 4.8 mEq/L (ref 3.5–5.1)
Sodium: 140 mEq/L (ref 135–145)

## 2018-04-13 LAB — TSH: TSH: 1.25 u[IU]/mL (ref 0.35–4.50)

## 2018-04-13 LAB — SEDIMENTATION RATE: Sed Rate: 6 mm/hr (ref 0–20)

## 2018-04-13 LAB — VITAMIN B12: Vitamin B-12: 255 pg/mL (ref 211–911)

## 2018-04-13 LAB — FERRITIN: Ferritin: 20.8 ng/mL (ref 10.0–291.0)

## 2018-04-13 MED ORDER — IOPAMIDOL (ISOVUE-300) INJECTION 61%
30.0000 mL | Freq: Once | INTRAVENOUS | Status: AC | PRN
  Administered 2018-04-13: 9 mL via INTRAVENOUS

## 2018-04-13 NOTE — Assessment & Plan Note (Signed)
Starting on sertraline today Followed and managed by psychiatry.

## 2018-04-13 NOTE — Telephone Encounter (Signed)
-----   Message from Romualdo BolkJill Evelyn Jertson, MD sent at 04/13/2018  4:47 PM EDT ----- Please inform the patient that her HSG was normal!!

## 2018-04-13 NOTE — Patient Instructions (Signed)
It was nice to meet you We'll let you know lab results when they return.

## 2018-04-13 NOTE — Progress Notes (Signed)
Katrina Phillips - 37 y.o. female MRN [id number]  Date of birth: 1980-10-16  Subjective Chief Complaint  Patient presents with  . Establish Care    numbness in extremities mostly at night, fatigue for a long time. Fasting    HPI Katrina Phillips is a 37 y.o. female here today to establish care with new pcp.  She has complaint of chronic fatigue with some occasional numb/tingly feeling in fingers and toes.  She has a history of depression and anxiety and is followed by psychiatry.  Previous tx with fluoxetine and wellbutrin however has been weaned from this and is starting sertraline today to help with better control of anxiety.  She is also being worked up for infertility.   -Fatigue:  Reports issues with chronic fatigue for several months.  Referred to neurology and recently had multiple sleep latency test, follows up for results tomorrow.  She has been on nuvigil previously and reports that this worked well but discontinued as she is trying to become pregnant.  She has had a history of iron deficiency and current prenatal vitamin has iron in it but unsure how much.  She has not had thyroid or B12 levels checked.   Has noticed muffled sound and occasional pain to right ear.  Has had some joint pain and stiffness in hands and hips at times.  Feels that she sleeps fairly well.  She denies any fever, chills, vision changes, or other neuro symptoms.    ROS:  ROS completed and negative except as noted per HPI No Known Allergies  Past Medical History:  Diagnosis Date  . Abnormal Pap smear of cervix 03/15/2017   HR HPV: +Detected  . ADHD   . Allergy    SEASONAL  . Anxiety   . Asthma    AS A CHILD  . Depression   . H/O concussion 1995    Past Surgical History:  Procedure Laterality Date  . COLPOSCOPY  2006  . WISDOM TOOTH EXTRACTION      Social History   Socioeconomic History  . Marital status: Married    Spouse name: Not on file  . Number of children: 0  . Years of education: Not on  file  . Highest education level: Not on file  Occupational History  . Occupation: grad student/assistant  Social Needs  . Financial resource strain: Not on file  . Food insecurity:    Worry: Not on file    Inability: Not on file  . Transportation needs:    Medical: Not on file    Non-medical: Not on file  Tobacco Use  . Smoking status: Never Smoker  . Smokeless tobacco: Never Used  Substance and Sexual Activity  . Alcohol use: Yes    Alcohol/week: 1.2 oz    Types: 2 Standard drinks or equivalent per week    Comment: social  . Drug use: No  . Sexual activity: Yes    Partners: Male    Birth control/protection: None  Lifestyle  . Physical activity:    Days per week: Not on file    Minutes per session: Not on file  . Stress: Not on file  Relationships  . Social connections:    Talks on phone: Not on file    Gets together: Not on file    Attends religious service: Not on file    Active member of club or organization: Not on file    Attends meetings of clubs or organizations: Not on file    Relationship status:  Not on file  Other Topics Concern  . Not on file  Social History Narrative  . Not on file    Family History  Problem Relation Age of Onset  . ADD / ADHD Mother   . GER disease Mother   . ADD / ADHD Sister   . Depression Brother   . ADD / ADHD Brother   . Celiac disease Father   . Depression Father   . Depression Maternal Grandmother   . GER disease Maternal Grandfather   . Heart disease Maternal Grandfather   . Celiac disease Paternal Grandmother   . Kidney disease Paternal Grandmother   . Celiac disease Paternal Uncle   . Melanoma Paternal Aunt     Health Maintenance  Topic Date Due  . HIV Screening  06/21/1998  . TETANUS/TDAP  06/21/2002  . INFLUENZA VACCINE  05/12/2018  . PAP SMEAR  03/15/2020     ----------------------------------------------------------------------------------------------------------------------------------------------------------------------------------------------------------------- Physical Exam BP 104/82 (BP Location: Left Arm, Patient Position: Sitting, Cuff Size: Normal)   Pulse 79   Temp 98.6 F (37 C) (Oral)   Ht _0  (1.753 m)   Wt 177 lb 3.2 oz (80.4 kg)   LMP 04/10/2018   SpO2 98%   BMI 26.17 kg/m   Physical Exam  Constitutional: She is oriented to person, place, and time. She appears well-nourished. No distress.  HENT:  Head: Normocephalic and atraumatic.  Mouth/Throat: Oropharynx is clear and moist.  Eyes: No scleral icterus.  Neck: Neck supple. No thyromegaly present.  Cardiovascular: Normal rate, regular rhythm, normal heart sounds and intact distal pulses.  Pulmonary/Chest: Effort normal and breath sounds normal.  Musculoskeletal: She exhibits no edema or tenderness.  Lymphadenopathy:    She has no cervical adenopathy.  Neurological: She is alert and oriented to person, place, and time. No cranial nerve deficit. Coordination normal.  Skin: Skin is warm and dry.  Psychiatric: She has a normal mood and affect. Her behavior is normal.    ------------------------------------------------------------------------------------------------------------------------------------------------------------------------------------------------------------------- Assessment and Plan  Depression with anxiety Starting on sertraline today Followed and managed by psychiatry.    Chronic fatigue Check TSH and cbc History of low ferritin, recheck Check B12 given some tingling sensation in extremities Check ESR with stiffness and joint pain.   Can consider lyme testing with joint/pain stiffness and fatigue as well if other labs are negative.  Has f/u with neuro tomorrow regarding recent sleep test.

## 2018-04-13 NOTE — Assessment & Plan Note (Addendum)
Check TSH and cbc History of low ferritin, recheck Check B12 given some tingling sensation in extremities Check ESR with stiffness and joint pain.   Can consider lyme testing with joint/pain stiffness and fatigue as well if other labs are negative.  Has f/u with neuro tomorrow regarding recent sleep test.

## 2018-04-13 NOTE — Telephone Encounter (Signed)
Left detailed message at number provided [phone], okay per ROI. Advised HSG was normal and to return call with any questions. Encounter closed.

## 2018-04-18 ENCOUNTER — Other Ambulatory Visit: Payer: Self-pay | Admitting: Family Medicine

## 2018-04-18 DIAGNOSIS — E538 Deficiency of other specified B group vitamins: Secondary | ICD-10-CM

## 2018-04-18 NOTE — Progress Notes (Signed)
-  Iron stores (ferritin) are still a little low.  She should continue prenatal with iron -B12 levels are at the very low end of normal, recommend additional labs for further evaluation of this.  Lab orders have been placed.

## 2018-04-20 ENCOUNTER — Other Ambulatory Visit (INDEPENDENT_AMBULATORY_CARE_PROVIDER_SITE_OTHER): Payer: BLUE CROSS/BLUE SHIELD

## 2018-04-20 DIAGNOSIS — E538 Deficiency of other specified B group vitamins: Secondary | ICD-10-CM | POA: Diagnosis not present

## 2018-04-20 LAB — FOLATE: Folate: >24.1 ng/mL (ref >5.9)

## 2018-04-21 ENCOUNTER — Other Ambulatory Visit: Payer: Self-pay

## 2018-04-21 ENCOUNTER — Encounter: Payer: Self-pay | Admitting: Neurology

## 2018-04-21 ENCOUNTER — Ambulatory Visit (INDEPENDENT_AMBULATORY_CARE_PROVIDER_SITE_OTHER): Payer: BLUE CROSS/BLUE SHIELD | Admitting: Neurology

## 2018-04-21 VITALS — BP 126/85 | HR 79

## 2018-04-21 DIAGNOSIS — G4711 Idiopathic hypersomnia with long sleep time: Secondary | ICD-10-CM

## 2018-04-21 NOTE — Patient Instructions (Signed)
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 This means, that you have a sleep disorder that manifests with excessive sleep and excessive sleepiness during the day. We may have to try different medications that may help you stay awake during the day, but since you have been trying to get pregnant, I would not recommend any new medication from my end of things. You can continue with the Nuvigil through your psychiatry provider for now. As you are looking for job, once you have a new position, I would be happy to help with providing any letter of support to help make accommodations at work for you.  In the future, we may try wake promoting medication such as a stimulant or non-stimulant medication aside from Nuvigil.  Not everything works with everybody the same way. The most common side effects with stimulants are weight loss, insomnia, nervousness, headaches, palpitations, rise in blood pressure, anxiety. Stimulants can be addictive and subject to abuse. Non-stimulant type wake promoting medications include Provigil and Nuvigil, most common side effects include headaches, nervousness, insomnia, hypertension.  As discussed, we will let you call to make a follow-up appointment when needed.
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
## 2018-04-23 LAB — METHYLMALONIC ACID, SERUM: Methylmalonic Acid, Quant: 138 nmol/L (ref 87–318)

## 2018-04-23 LAB — HOMOCYSTEINE: Homocysteine: 6.1 umol/L (ref <10.4)

## 2018-04-25 NOTE — Progress Notes (Signed)
Additional results are normal.  Her b12 levels are low but I don't think that she has a true B12 deficiency.   I do think she would benefit from taking just a 500 mcg oral b12 supplement daily

## 2018-05-10 ENCOUNTER — Telehealth: Payer: Self-pay | Admitting: Obstetrics and Gynecology

## 2018-05-10 DIAGNOSIS — F3341 Major depressive disorder, recurrent, in partial remission: Secondary | ICD-10-CM | POA: Diagnosis not present

## 2018-05-10 NOTE — Telephone Encounter (Signed)
Patient called and states she needs to schedule appointment for labs on day 3 after she starts her cycle. She started her cycle last night (05/09/18). Please advise.

## 2018-05-10 NOTE — Telephone Encounter (Signed)
Spoke with patient. Patient is calling to schedule day 3 FSH, estradiol, AMH, TSH, prolactin. Started menses 05/09/2018. Lab appointment scheduled for 05/11/2018 at 1:30 pm. Patient is agreeable to date and time. Encounter closed.

## 2018-05-10 NOTE — Telephone Encounter (Signed)
Left message to call Kaitlyn at [phone]. 

## 2018-05-11 ENCOUNTER — Other Ambulatory Visit: Payer: Self-pay | Admitting: Obstetrics and Gynecology

## 2018-05-11 ENCOUNTER — Other Ambulatory Visit (INDEPENDENT_AMBULATORY_CARE_PROVIDER_SITE_OTHER): Payer: BLUE CROSS/BLUE SHIELD

## 2018-05-11 DIAGNOSIS — N979 Female infertility, unspecified: Secondary | ICD-10-CM

## 2018-05-14 LAB — FOLLICLE STIMULATING HORMONE: FSH: 4.5 m[IU]/mL

## 2018-05-14 LAB — TSH: TSH: 1.67 u[IU]/mL (ref 0.450–4.500)

## 2018-05-14 LAB — PROLACTIN: Prolactin: 10 ng/mL (ref 4.8–23.3)

## 2018-05-14 LAB — ANTI MULLERIAN HORMONE: ANTI-MULLERIAN HORMONE (AMH): 8.24 ng/mL

## 2018-07-14 ENCOUNTER — Telehealth: Payer: Self-pay | Admitting: Obstetrics and Gynecology

## 2018-07-14 DIAGNOSIS — N979 Female infertility, unspecified: Secondary | ICD-10-CM

## 2018-07-14 NOTE — Telephone Encounter (Signed)
Dr. Oscar La,  Okay to place order for semen analysis and referral to [facility] fertility at this time?

## 2018-07-14 NOTE — Telephone Encounter (Signed)
Patient would like a referral to an infertility specialist.

## 2018-07-14 NOTE — Telephone Encounter (Signed)
Yes, please.

## 2018-07-15 NOTE — Telephone Encounter (Signed)
Referral faxed with order for semen analysis.  Kit up front for pick up.  Cc [employer] .

## 2018-07-26 ENCOUNTER — Telehealth: Payer: Self-pay

## 2018-07-26 NOTE — Telephone Encounter (Signed)
Spoke with patient and advised her it was time for her repeat pap.  She is unable to schedule at this time but will call back.

## 2018-08-01 NOTE — Progress Notes (Addendum)
37 y.o. [id number] Married White or Caucasian Not Hispanic or Latino female here for annual exam.   Period Cycle (Days): 33 Period Duration (Days): 4 days Period Pattern: Regular Menstrual Flow: Moderate Menstrual Control: Tampon, Thin pad Menstrual Control Change Freq (Hours): changes tampon/pad every 4-6 hours Dysmenorrhea: (!) Mild Dysmenorrhea Symptoms: Cramping  She was seen this summer for infertility. Normal prolactin, TSH, FSH, AMH. Cycles have been 33 days, + ovulation on predictor kits and BBT charts. Ovulates between day 18 and 22. Normal HSG. She has a referral to [place]. Husband with normal SA  Patient's last menstrual period was 07/12/2018 (exact date).          Sexually active: Yes.    The current method of family planning is none.    Exercising: Yes.    walking, weights Smoker:  no  Health Maintenance: Pap:  03/15/17 returned with ASCUS can't r/o HGSIL, 06/08/2017 LEEP high grade dysplasia, the margins and the ECC were negative for dysplasia.  History of abnormal Pap:  yes Colonoscopy: Never TDaP:  2017 Gardasil: Never, would like to start it.    reports that she has never smoked. She has never used smokeless tobacco. She reports that she drinks about 1.0 - 4.0 standard drinks of alcohol per week. She reports that she does not use drugs. 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  She is seeing a Therapist, sports, is starting with a therapist. Manageable and she knows what she wants to do   Past Medical History:  Diagnosis Date  . Abnormal Pap smear of cervix 03/15/2017   HR HPV: +Detected  . ADHD   . Allergy    SEASONAL  . Anxiety   . Asthma    AS A CHILD  . Depression   . H/O concussion 1995    Past Surgical History:  Procedure Laterality Date  . COLPOSCOPY  2006  . WISDOM TOOTH EXTRACTION      Current Outpatient Medications  Medication Sig Dispense Refill  . buPROPion (WELLBUTRIN XL) 300 MG 24 hr tablet Take 300 mg by mouth daily.     Marland Kitchen FLUoxetine (PROZAC) 20 MG tablet Take 20 mg by mouth daily.    . Prenatal Vit-Fe Fumarate-FA (PRENATAL PO) Take by mouth daily.     Current Facility-Administered Medications  Medication Dose Route Frequency Provider Last Rate Last Dose  . 0.9 %  sodium chloride infusion  500 mL Intravenous Continuous Nandigam, Eleonore Chiquito, MD        Family History  Problem Relation Age of Onset  . ADD / ADHD Mother   . GER disease Mother   . ADD / ADHD Sister   . Depression Brother   . ADD / ADHD Brother   . Celiac disease Father   . Depression Father   . Depression Maternal Grandmother   . GER disease Maternal Grandfather   . Heart disease Maternal Grandfather   . Celiac disease Paternal Grandmother   . Kidney disease Paternal Grandmother   . Celiac disease Paternal Uncle   . Melanoma Paternal Aunt     Review of Systems  Constitutional: Negative.   HENT: Negative.   Eyes: Negative.   Respiratory: Negative.   Cardiovascular: Negative.   Gastrointestinal: Negative.   Endocrine: Negative.   Genitourinary: Negative.        Loss of sexual interest  Musculoskeletal: Negative.   Skin: Negative.   Allergic/Immunologic: Negative.   Neurological: Positive for headaches.  Hematological: Negative.   Psychiatric/Behavioral: Negative.  Depression    Exam:   BP 132/82 (BP Location: Right Arm, Patient Position: Sitting, Cuff Size: Normal)   Pulse 80   Ht 5\' 9"  (1.753 m)   Wt 181 lb 3.2 oz (82.2 kg)   LMP 07/12/2018 (Exact Date)   BMI 26.76 kg/m   Weight change: @WEIGHTCHANGE @ Height:   Height: 5\' 9"  (175.3 cm)  Ht Readings from Last 3 Encounters:  08/02/18 5\' 9"  (1.753 m)  04/13/18 5\' 9"  (1.753 m)  03/16/18 5' 8.25" (1.734 m)    General appearance: alert, cooperative and appears stated age Head: Normocephalic, without obvious abnormality, atraumatic Neck: no adenopathy, supple, symmetrical, trachea midline and thyroid normal to inspection and palpation Lungs: clear to  auscultation bilaterally Cardiovascular: regular rate and rhythm Breasts: normal appearance, no masses or tenderness Abdomen: soft, non-tender; non distended,  no masses,  no organomegaly Extremities: extremities normal, atraumatic, no cyanosis or edema Skin: Skin color, texture, turgor normal. No rashes or lesions Lymph nodes: Cervical, supraclavicular, and axillary nodes normal. No abnormal inguinal nodes palpated Neurologic: Grossly normal   Pelvic: External genitalia:  no lesions              Urethra:  normal appearing urethra with no masses, tenderness or lesions              Bartholins and Skenes: normal                 Vagina: normal appearing vagina with normal color and discharge, no lesions              Cervix: no lesions and evidence of prior leep, friable with pap               Bimanual Exam:  Uterus:  normal size, contour, position, consistency, mobility, non-tender              Adnexa: no mass, fullness, tenderness               Rectovaginal: Confirms               Anus:  normal sphincter tone, no lesions  Chaperone was present for exam.  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 H/O BV, she does notice d/c, not sure if it is abnormal. Will check for BV.   Would like to start the gardasil series. Left prior to injection.

## 2018-08-02 ENCOUNTER — Encounter: Payer: Self-pay | Admitting: Obstetrics and Gynecology

## 2018-08-02 ENCOUNTER — Other Ambulatory Visit: Payer: Self-pay

## 2018-08-02 ENCOUNTER — Telehealth: Payer: Self-pay | Admitting: Obstetrics and Gynecology

## 2018-08-02 ENCOUNTER — Other Ambulatory Visit (HOSPITAL_COMMUNITY)
Admission: RE | Admit: 2018-08-02 | Discharge: 2018-08-02 | Disposition: A | Discharge status: Home / Self Care | Payer: BLUE CROSS/BLUE SHIELD | Source: Ambulatory Visit | Attending: Obstetrics and Gynecology | Admitting: Obstetrics and Gynecology

## 2018-08-02 ENCOUNTER — Ambulatory Visit: Payer: BLUE CROSS/BLUE SHIELD | Admitting: Obstetrics and Gynecology

## 2018-08-02 VITALS — BP 132/82 | HR 80 | Ht 69.0 in | Wt 181.2 lb

## 2018-08-02 DIAGNOSIS — Z87410 Personal history of cervical dysplasia: Secondary | ICD-10-CM | POA: Diagnosis not present

## 2018-08-02 DIAGNOSIS — Z01419 Encounter for gynecological examination (general) (routine) without abnormal findings: Secondary | ICD-10-CM

## 2018-08-02 DIAGNOSIS — Z124 Encounter for screening for malignant neoplasm of cervix: Secondary | ICD-10-CM | POA: Diagnosis not present

## 2018-08-02 DIAGNOSIS — N898 Other specified noninflammatory disorders of vagina: Secondary | ICD-10-CM | POA: Diagnosis not present

## 2018-08-02 DIAGNOSIS — Z3169 Encounter for other general counseling and advice on procreation: Secondary | ICD-10-CM | POA: Diagnosis not present

## 2018-08-02 NOTE — Telephone Encounter (Signed)
At check-out, patient requested to have a nurse give her a call regarding getting Gardasil.

## 2018-08-02 NOTE — Patient Instructions (Signed)
EXERCISE AND DIET:  We recommended that you start or continue a regular exercise program for good health. Regular exercise means any activity that makes your heart beat faster and makes you sweat.  We recommend exercising at least 30 minutes per day at least 3 days a week, preferably 4 or 5.  We also recommend a diet low in fat and sugar.  Inactivity, poor dietary choices and obesity can cause diabetes, heart attack, stroke, and kidney damage, among others.    ALCOHOL AND SMOKING:  Women should limit their alcohol intake to no more than 7 drinks/beers/glasses of wine (combined, not each!) per week. Moderation of alcohol intake to this level decreases your risk of breast cancer and liver damage. And of course, no recreational drugs are part of a healthy lifestyle.  And absolutely no smoking or even second hand smoke. Most people know smoking can cause heart and lung diseases, but did you know it also contributes to weakening of your bones? Aging of your skin?  Yellowing of your teeth and nails?  CALCIUM AND VITAMIN D:  Adequate intake of calcium and Vitamin D are recommended.  The recommendations for exact amounts of these supplements seem to change often, but generally speaking 600 mg of calcium (either carbonate or citrate) and 800 units of Vitamin D per day seems prudent. Certain women may benefit from higher intake of Vitamin D.  If you are among these women, your doctor will have told you during your visit.    PAP SMEARS:  Pap smears, to check for cervical cancer or precancers,  have traditionally been done yearly, although recent scientific advances have shown that most women can have pap smears less often.  However, every woman still should have a physical exam from her gynecologist every year. It will include a breast check, inspection of the vulva and vagina to check for abnormal growths or skin changes, a visual exam of the cervix, and then an exam to evaluate the size and shape of the uterus and  ovaries.  And after 35 years of age, a rectal exam is indicated to check for rectal cancers. We will also provide age appropriate advice regarding health maintenance, like when you should have certain vaccines, screening for sexually transmitted diseases, bone density testing, colonoscopy, mammograms, etc.   MAMMOGRAMS:  All women over 40 years old should have a yearly mammogram. Many facilities now offer a "3D" mammogram, which may cost around $50 extra out of pocket. If possible,  we recommend you accept the option to have the 3D mammogram performed.  It both reduces the number of women who will be called back for extra views which then turn out to be normal, and it is better than the routine mammogram at detecting truly abnormal areas.    COLONOSCOPY:  Colonoscopy to screen for colon cancer is recommended for all women at age 50.  We know, you hate the idea of the prep.  We agree, BUT, having colon cancer and not knowing it is worse!!  Colon cancer so often starts as a polyp that can be seen and removed at colonscopy, which can quite literally save your life!  And if your first colonoscopy is normal and you have no family history of colon cancer, most women don't have to have it again for 10 years.  Once every ten years, you can do something that may end up saving your life, right?  We will be happy to help you get it scheduled when you are ready.    Be sure to check your insurance coverage so you understand how much it will cost.  It may be covered as a preventative service at no cost, but you should check your particular policy.      Breast Self-Awareness Breast self-awareness means being familiar with how your breasts look and feel. It involves checking your breasts regularly and reporting any changes to your health care provider. Practicing breast self-awareness is important. A change in your breasts can be a sign of a serious medical problem. Being familiar with how your breasts look and feel allows  you to find any problems early, when treatment is more likely to be successful. All women should practice breast self-awareness, including women who have had breast implants. How to do a breast self-exam One way to learn what is normal for your breasts and whether your breasts are changing is to do a breast self-exam. To do a breast self-exam: Look for Changes  1. Remove all the clothing above your waist. 2. Stand in front of a mirror in a room with good lighting. 3. Put your hands on your hips. 4. Push your hands firmly downward. 5. Compare your breasts in the mirror. Look for differences between them (asymmetry), such as: ? Differences in shape. ? Differences in size. ? Puckers, dips, and bumps in one breast and not the other. 6. Look at each breast for changes in your skin, such as: ? Redness. ? Scaly areas. 7. Look for changes in your nipples, such as: ? Discharge. ? Bleeding. ? Dimpling. ? Redness. ? A change in position. Feel for Changes  Carefully feel your breasts for lumps and changes. It is best to do this while lying on your back on the floor and again while sitting or standing in the shower or tub with soapy water on your skin. Feel each breast in the following way:  Place the arm on the side of the breast you are examining above your head.  Feel your breast with the other hand.  Start in the nipple area and make  inch (2 cm) overlapping circles to feel your breast. Use the pads of your three middle fingers to do this. Apply light pressure, then medium pressure, then firm pressure. The light pressure will allow you to feel the tissue closest to the skin. The medium pressure will allow you to feel the tissue that is a little deeper. The firm pressure will allow you to feel the tissue close to the ribs.  Continue the overlapping circles, moving downward over the breast until you feel your ribs below your breast.  Move one finger-width toward the center of the body.  Continue to use the  inch (2 cm) overlapping circles to feel your breast as you move slowly up toward your collarbone.  Continue the up and down exam using all three pressures until you reach your armpit.  Write Down What You Find  Write down what is normal for each breast and any changes that you find. Keep a written record with breast changes or normal findings for each breast. By writing this information down, you do not need to depend only on memory for size, tenderness, or location. Write down where you are in your menstrual cycle, if you are still menstruating. If you are having trouble noticing differences in your breasts, do not get discouraged. With time you will become more familiar with the variations in your breasts and more comfortable with the exam. How often should I examine my breasts? Examine   your breasts every month. If you are breastfeeding, the best time to examine your breasts is after a feeding or after using a breast pump. If you menstruate, the best time to examine your breasts is 5-7 days after your period is over. During your period, your breasts are lumpier, and it may be more difficult to notice changes. When should I see my health care provider? See your health care provider if you notice:  A change in shape or size of your breasts or nipples.  A change in the skin of your breast or nipples, such as a reddened or scaly area.  Unusual discharge from your nipples.  A lump or thick area that was not there before.  Pain in your breasts.  Anything that concerns you.  This information is not intended to replace advice given to you by your health care provider. Make sure you discuss any questions you have with your health care provider. Document Released: 09/28/2005 Document Revised: 03/05/2016 Document Reviewed: 08/18/2015 Elsevier Interactive Patient Education  2018 Elsevier Inc.  

## 2018-08-02 NOTE — Telephone Encounter (Signed)
Left message to call Saphronia Ozdemir at [phone]. 

## 2018-08-02 NOTE — Addendum Note (Signed)
Addended by: Tobi Bastos on: 08/02/2018 11:41 AM   Modules accepted: Orders

## 2018-08-03 ENCOUNTER — Ambulatory Visit: Payer: BLUE CROSS/BLUE SHIELD | Admitting: Psychiatry

## 2018-08-03 DIAGNOSIS — F418 Other specified anxiety disorders: Secondary | ICD-10-CM

## 2018-08-03 DIAGNOSIS — F909 Attention-deficit hyperactivity disorder, unspecified type: Secondary | ICD-10-CM | POA: Diagnosis not present

## 2018-08-03 MED ORDER — FLUOXETINE HCL 20 MG PO TABS: 20.0000 mg | ORAL_TABLET | Freq: Every day | ORAL | 1 refills | Status: DC

## 2018-08-03 MED ORDER — BUPROPION HCL ER (XL) 300 MG PO TB24: 300.0000 mg | ORAL_TABLET | Freq: Every day | ORAL | 1 refills | Status: DC

## 2018-08-03 NOTE — Progress Notes (Signed)
[facility]  Patient ID: Katrina Phillips,  MRN: [id number]  PCP: Everrett Coombe, DO  Date of Evaluation: 08/03/2018 Time spent:20 minutes   HISTORY/CURRENT STATUS: HPI patient is a 37 year old white female last seen 07/06/2018.  Depression was still bad at that time.  Complaining of feeling stuck and not going anywhere.  Not suicidal thoughts.  Patient requested switching the Zoloft to Prozac which we did.  She is to continue her Wellbutrin XL for ADHD.  Patient is slightly worse again no suicidal thoughts.  Is tired restless decreased motivation and having problem with job search.  Time her depression was still bad she said she felt like she was stuck in things in life were going anywhere.  She denies suicidal thoughts.  Per patient request we switched her from Zoloft to Prozac.  Continue Wellbutrin XL for ADHD.  Patient is attempting pregnancy.  Individual Medical History/ Review of Systems: Changes? none Allergies: Patient has no known allergies.  Current Medications:  Current Outpatient Medications:  .  buPROPion (WELLBUTRIN XL) 300 MG 24 hr tablet, Take 1 tablet (300 mg total) by mouth daily., Disp: 30 tablet, Rfl: 1 .  FLUoxetine (PROZAC) 20 MG tablet, Take 1 tablet (20 mg total) by mouth daily., Disp: 30 tablet, Rfl: 1 .  Prenatal Vit-Fe Fumarate-FA (PRENATAL PO), Take by mouth daily., Disp: , Rfl:   Current Facility-Administered Medications:  .  0.9 %  sodium chloride infusion, 500 mL, Intravenous, Continuous, Nandigam, Eleonore Chiquito, MD Medication Side Effects: None  Family Medical/ Social History: Changes? Yes continues with job search. Still attempting pregnancy.  MENTAL HEALTH EXAM:  Last menstrual period 07/12/2018.There is no height or weight on file to calculate BMI.  General Appearance: Casual  Eye Contact:  Good  Speech:  Normal Rate  Volume:  Normal  Mood:  Depressed  Affect:  Appropriate  Thought Process:  Goal Directed  Orientation:  Full (Time,  Place, and Person)  Thought Content: Logical   Suicidal Thoughts:  No  Homicidal Thoughts:  No  Memory:  normal  Judgement:  Good  Insight:  Good  Psychomotor Activity:  Normal  Concentration:  Concentration: Good  Recall:  Good  Fund of Knowledge: Good  Language: Good  Akathisia:  NA  AIMS (if indicated): na  Assets:  Social Support  ADL's:  Intact  Cognition: WNL  Prognosis:  Good    DIAGNOSES:    ICD-10-CM   1. Depression with anxiety F41.8   2. Attention deficit hyperactivity disorder (ADHD), unspecified ADHD type F90.9     RECOMMENDATIONS: We are increasing the Prozac from 10 mg to 20 mg for further help with depression.  We will follow her decreased motivation and restlessness.  She is on Wellbutrin XL for the ADHD but does not feel like this is helping, but continued.  She is encouraged to contact [facility] psychology department for help with nonmedicinal  ADHD treatment She is to return in 1 month Attempting to get pregnant.    Anne Fu, PA-C

## 2018-08-05 LAB — CYTOLOGY - PAP
Bacterial vaginitis: POSITIVE — AB
Diagnosis: NEGATIVE
HPV: NOT DETECTED

## 2018-08-08 ENCOUNTER — Telehealth: Payer: Self-pay | Admitting: *Deleted

## 2018-08-08 NOTE — Telephone Encounter (Signed)
-----   Message from Romualdo Bolk, MD sent at 08/05/2018  5:01 PM EDT ----- Please inform the patient that her pap was + for BV and treat with flagyl (either oral or vaginal, her choice), no ETOH while on Flagyl.  Oral: Flagyl 500 mg BID x 7 days, or Vaginal: Metrogel, 1 applicator per vagina q day x 5 days. Pap was negative with negative hpv! H/o leep last year. Needs repeat pap in one year

## 2018-08-08 NOTE — Telephone Encounter (Signed)
Notes recorded by Leda Min, RN on 08/08/2018 at 4:01 PM EDT Left message to call Noreene Larsson, RN at [facility] [phone].   08 recall placed ------

## 2018-08-10 MED ORDER — METRONIDAZOLE 0.75 % VA GEL: 1.0000 | Freq: Every day | VAGINAL | 0 refills | Status: AC

## 2018-08-10 NOTE — Telephone Encounter (Signed)
Spoke with patient, advised as seen below per Dr. Oscar La. Patient request RX for metrogel. Patient is due to start menses 08/13/18, advised to start metrogel after menses. Patient states she is trying to get pregnant, ok to take if pregnant? Advised if any chance of pregnancy, do not use metrogel, return call to office if menses does not start or with positive UPT. Patient verbalizes understanding and is agreeable. Will review with Dr. Oscar La and return call with any additional recommendations.   Routing to Dr. Reyne Dumas to review.

## 2018-08-11 NOTE — Telephone Encounter (Signed)
Symptomatic BV can be treated in pregnancy. The oral route is preferred, mainly because of treatment of any low level issue in the upper genital tract. No known harmful effects. CDC states it is okay to use flagyl even in the first trimester of pregnancy.  Metrogel should also be safe, it just isn't the preferred drug in pregnancy. If she were using the metrogel and found out she was pregnant she should still be fine.

## 2018-08-11 NOTE — Telephone Encounter (Signed)
Left detailed message, ok per dpr. Advised as seen below per Dr. Jertson. Advised to return call to office if any additional questions. Encounter closed.    

## 2018-08-18 ENCOUNTER — Encounter: Payer: Self-pay | Admitting: Emergency Medicine

## 2018-08-18 DIAGNOSIS — F909 Attention-deficit hyperactivity disorder, unspecified type: Secondary | ICD-10-CM | POA: Insufficient documentation

## 2018-08-21 ENCOUNTER — Other Ambulatory Visit: Payer: Self-pay | Admitting: Psychiatry

## 2018-08-23 NOTE — Telephone Encounter (Signed)
OK to close encounter. 

## 2018-08-23 NOTE — Telephone Encounter (Signed)
Left message to call Solange Emry at [phone]. 

## 2018-08-25 NOTE — Telephone Encounter (Signed)
Patient has not returned call x 2 to discuss Gardasil. Encounter closed.

## 2018-08-30 DIAGNOSIS — Z319 Encounter for procreative management, unspecified: Secondary | ICD-10-CM | POA: Diagnosis not present

## 2018-08-30 DIAGNOSIS — E288 Other ovarian dysfunction: Secondary | ICD-10-CM | POA: Diagnosis not present

## 2018-08-31 DIAGNOSIS — F332 Major depressive disorder, recurrent severe without psychotic features: Secondary | ICD-10-CM | POA: Diagnosis not present

## 2018-09-05 ENCOUNTER — Ambulatory Visit: Payer: BLUE CROSS/BLUE SHIELD | Admitting: Psychiatry

## 2018-09-26 DIAGNOSIS — E288 Other ovarian dysfunction: Secondary | ICD-10-CM | POA: Diagnosis not present

## 2018-09-26 DIAGNOSIS — Z319 Encounter for procreative management, unspecified: Secondary | ICD-10-CM | POA: Diagnosis not present

## 2018-09-30 DIAGNOSIS — E288 Other ovarian dysfunction: Secondary | ICD-10-CM | POA: Diagnosis not present

## 2018-09-30 DIAGNOSIS — Z319 Encounter for procreative management, unspecified: Secondary | ICD-10-CM | POA: Diagnosis not present

## 2018-10-12 NOTE — L&D Delivery Note (Addendum)
Delivery Note At 9:08 AM a non-viable female was delivered via Vaginal, Spontaneous (Presentation: OA).  APGAR:0, weight -pending Placenta status: spontaneous, complete. Cord:  with the following complications: tight nuchal cord  Anesthesia:  epidural Episiotomy:  none Lacerations:  1st degree perineal laceration  Suture Repair: 3.0 vicryl rapide Est. Blood Loss (mL):  300  Mom to postpartum.  Baby to [place].  Couple have not decided on autopsy and now with info re tight nuchal cord, no anatomical abnormality, normal 1st trim screen, they are inclining towards not requesting autopsy. Will send placenta to path and cord sample for Anora genetic testing with their consent. Burial plan for baby girl is yet to be confirmed. Very appropriate post-delivery response from parents with this unfortunate outcome.  H/o Anxiety/depression, continue Prozac. Will continue to have assistance from [place].  Patient may request early discharge later today if remains stable.   Elveria Royals 09/08/2019, 9:59 AM

## 2018-11-02 DIAGNOSIS — Z3189 Encounter for other procreative management: Secondary | ICD-10-CM | POA: Diagnosis not present

## 2018-11-05 DIAGNOSIS — Z3189 Encounter for other procreative management: Secondary | ICD-10-CM | POA: Diagnosis not present

## 2018-11-29 DIAGNOSIS — N970 Female infertility associated with anovulation: Secondary | ICD-10-CM | POA: Diagnosis not present

## 2018-12-02 DIAGNOSIS — Z3189 Encounter for other procreative management: Secondary | ICD-10-CM | POA: Diagnosis not present

## 2018-12-02 DIAGNOSIS — F332 Major depressive disorder, recurrent severe without psychotic features: Secondary | ICD-10-CM | POA: Diagnosis not present

## 2018-12-16 DIAGNOSIS — F332 Major depressive disorder, recurrent severe without psychotic features: Secondary | ICD-10-CM | POA: Diagnosis not present

## 2018-12-19 DIAGNOSIS — F332 Major depressive disorder, recurrent severe without psychotic features: Secondary | ICD-10-CM | POA: Diagnosis not present

## 2018-12-20 DIAGNOSIS — F332 Major depressive disorder, recurrent severe without psychotic features: Secondary | ICD-10-CM | POA: Diagnosis not present

## 2018-12-23 DIAGNOSIS — F332 Major depressive disorder, recurrent severe without psychotic features: Secondary | ICD-10-CM | POA: Diagnosis not present

## 2018-12-26 DIAGNOSIS — F332 Major depressive disorder, recurrent severe without psychotic features: Secondary | ICD-10-CM | POA: Diagnosis not present

## 2018-12-29 DIAGNOSIS — F332 Major depressive disorder, recurrent severe without psychotic features: Secondary | ICD-10-CM | POA: Diagnosis not present

## 2018-12-30 DIAGNOSIS — F332 Major depressive disorder, recurrent severe without psychotic features: Secondary | ICD-10-CM | POA: Diagnosis not present

## 2018-12-30 DIAGNOSIS — Z319 Encounter for procreative management, unspecified: Secondary | ICD-10-CM | POA: Diagnosis not present

## 2018-12-30 DIAGNOSIS — Z3189 Encounter for other procreative management: Secondary | ICD-10-CM | POA: Diagnosis not present

## 2019-01-02 DIAGNOSIS — F332 Major depressive disorder, recurrent severe without psychotic features: Secondary | ICD-10-CM | POA: Diagnosis not present

## 2019-01-03 DIAGNOSIS — F332 Major depressive disorder, recurrent severe without psychotic features: Secondary | ICD-10-CM | POA: Diagnosis not present

## 2019-01-05 DIAGNOSIS — Z3189 Encounter for other procreative management: Secondary | ICD-10-CM | POA: Diagnosis not present

## 2019-01-05 DIAGNOSIS — F332 Major depressive disorder, recurrent severe without psychotic features: Secondary | ICD-10-CM | POA: Diagnosis not present

## 2019-01-06 DIAGNOSIS — F332 Major depressive disorder, recurrent severe without psychotic features: Secondary | ICD-10-CM | POA: Diagnosis not present

## 2019-01-09 DIAGNOSIS — F332 Major depressive disorder, recurrent severe without psychotic features: Secondary | ICD-10-CM | POA: Diagnosis not present

## 2019-01-10 DIAGNOSIS — F332 Major depressive disorder, recurrent severe without psychotic features: Secondary | ICD-10-CM | POA: Diagnosis not present

## 2019-01-12 DIAGNOSIS — F332 Major depressive disorder, recurrent severe without psychotic features: Secondary | ICD-10-CM | POA: Diagnosis not present

## 2019-01-13 DIAGNOSIS — F332 Major depressive disorder, recurrent severe without psychotic features: Secondary | ICD-10-CM | POA: Diagnosis not present

## 2019-01-16 DIAGNOSIS — F332 Major depressive disorder, recurrent severe without psychotic features: Secondary | ICD-10-CM | POA: Diagnosis not present

## 2019-01-17 DIAGNOSIS — F332 Major depressive disorder, recurrent severe without psychotic features: Secondary | ICD-10-CM | POA: Diagnosis not present

## 2019-01-19 DIAGNOSIS — F332 Major depressive disorder, recurrent severe without psychotic features: Secondary | ICD-10-CM | POA: Diagnosis not present

## 2019-01-20 DIAGNOSIS — F332 Major depressive disorder, recurrent severe without psychotic features: Secondary | ICD-10-CM | POA: Diagnosis not present

## 2019-01-23 DIAGNOSIS — F332 Major depressive disorder, recurrent severe without psychotic features: Secondary | ICD-10-CM | POA: Diagnosis not present

## 2019-01-24 DIAGNOSIS — F332 Major depressive disorder, recurrent severe without psychotic features: Secondary | ICD-10-CM | POA: Diagnosis not present

## 2019-01-26 ENCOUNTER — Telehealth: Payer: Self-pay | Admitting: Obstetrics and Gynecology

## 2019-01-26 DIAGNOSIS — F332 Major depressive disorder, recurrent severe without psychotic features: Secondary | ICD-10-CM | POA: Diagnosis not present

## 2019-01-26 NOTE — Telephone Encounter (Signed)
Patient had 3 positive UPT (one Thursday, one Friday and 1 Saturday). Patient is wanting to come in for pregnancy confirmation and to discuss OB care.

## 2019-01-26 NOTE — Telephone Encounter (Signed)
Return call to patient. Left message to call back and ask for triage nurse. 

## 2019-01-27 DIAGNOSIS — F332 Major depressive disorder, recurrent severe without psychotic features: Secondary | ICD-10-CM | POA: Diagnosis not present

## 2019-01-30 DIAGNOSIS — F332 Major depressive disorder, recurrent severe without psychotic features: Secondary | ICD-10-CM | POA: Diagnosis not present

## 2019-01-31 DIAGNOSIS — F332 Major depressive disorder, recurrent severe without psychotic features: Secondary | ICD-10-CM | POA: Diagnosis not present

## 2019-02-02 DIAGNOSIS — F332 Major depressive disorder, recurrent severe without psychotic features: Secondary | ICD-10-CM | POA: Diagnosis not present

## 2019-02-02 DIAGNOSIS — Z3200 Encounter for pregnancy test, result unknown: Secondary | ICD-10-CM | POA: Diagnosis not present

## 2019-02-03 DIAGNOSIS — F332 Major depressive disorder, recurrent severe without psychotic features: Secondary | ICD-10-CM | POA: Diagnosis not present

## 2019-02-07 DIAGNOSIS — F332 Major depressive disorder, recurrent severe without psychotic features: Secondary | ICD-10-CM | POA: Diagnosis not present

## 2019-02-09 DIAGNOSIS — F332 Major depressive disorder, recurrent severe without psychotic features: Secondary | ICD-10-CM | POA: Diagnosis not present

## 2019-02-10 DIAGNOSIS — F332 Major depressive disorder, recurrent severe without psychotic features: Secondary | ICD-10-CM | POA: Diagnosis not present

## 2019-02-14 DIAGNOSIS — F332 Major depressive disorder, recurrent severe without psychotic features: Secondary | ICD-10-CM | POA: Diagnosis not present

## 2019-02-16 DIAGNOSIS — F332 Major depressive disorder, recurrent severe without psychotic features: Secondary | ICD-10-CM | POA: Diagnosis not present

## 2019-02-17 DIAGNOSIS — F332 Major depressive disorder, recurrent severe without psychotic features: Secondary | ICD-10-CM | POA: Diagnosis not present

## 2019-02-20 DIAGNOSIS — F332 Major depressive disorder, recurrent severe without psychotic features: Secondary | ICD-10-CM | POA: Diagnosis not present

## 2019-02-21 DIAGNOSIS — F332 Major depressive disorder, recurrent severe without psychotic features: Secondary | ICD-10-CM | POA: Diagnosis not present

## 2019-02-22 NOTE — Telephone Encounter (Signed)
Left message to call Kaitlyn at [phone]. 

## 2019-02-23 DIAGNOSIS — F332 Major depressive disorder, recurrent severe without psychotic features: Secondary | ICD-10-CM | POA: Diagnosis not present

## 2019-02-24 DIAGNOSIS — O0900 Supervision of pregnancy with history of infertility, unspecified trimester: Secondary | ICD-10-CM | POA: Diagnosis not present

## 2019-02-24 NOTE — Telephone Encounter (Signed)
Patient has not returned call x 2. Encounter closed.

## 2019-02-27 IMAGING — RF DG HYSTEROGRAM
6 series · 6 of 6 positions shown · IV contrast (omnipaque)
Comparison: None.

CLINICAL DATA: Infertility.

EXAM:
HYSTEROSALPINGOGRAM
TECHNIQUE: Following cleansing of the cervix and vagina with Betadine solution,
a hysterosalpingogram was performed using a 5-French
hysterosalpingogram catheter and Omnipaque 300 contrast. The patient
tolerated the examination without difficulty.

[Series 1: run · 1 of 1 slices shown (1 of 6)]
[im 1/1]
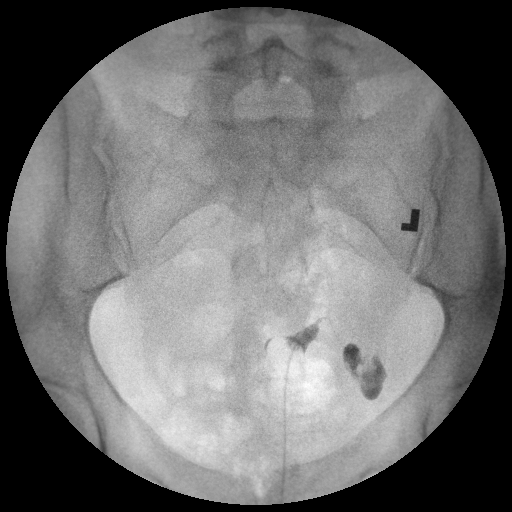

[Series 2: run · 1 of 1 slices shown (2 of 6)]
[im 1/1]
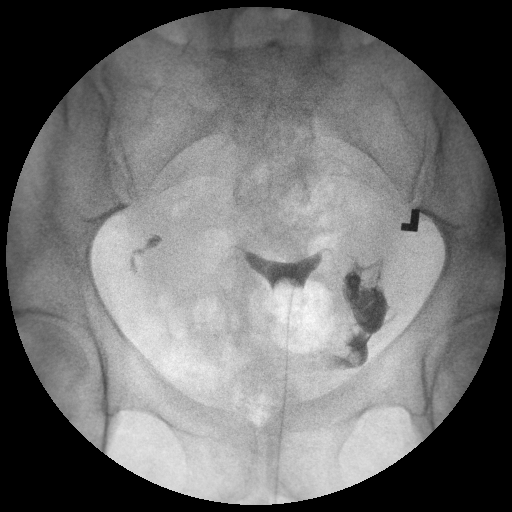

[Series 3: run · 1 of 1 slices shown (3 of 6)]
[im 1/1]
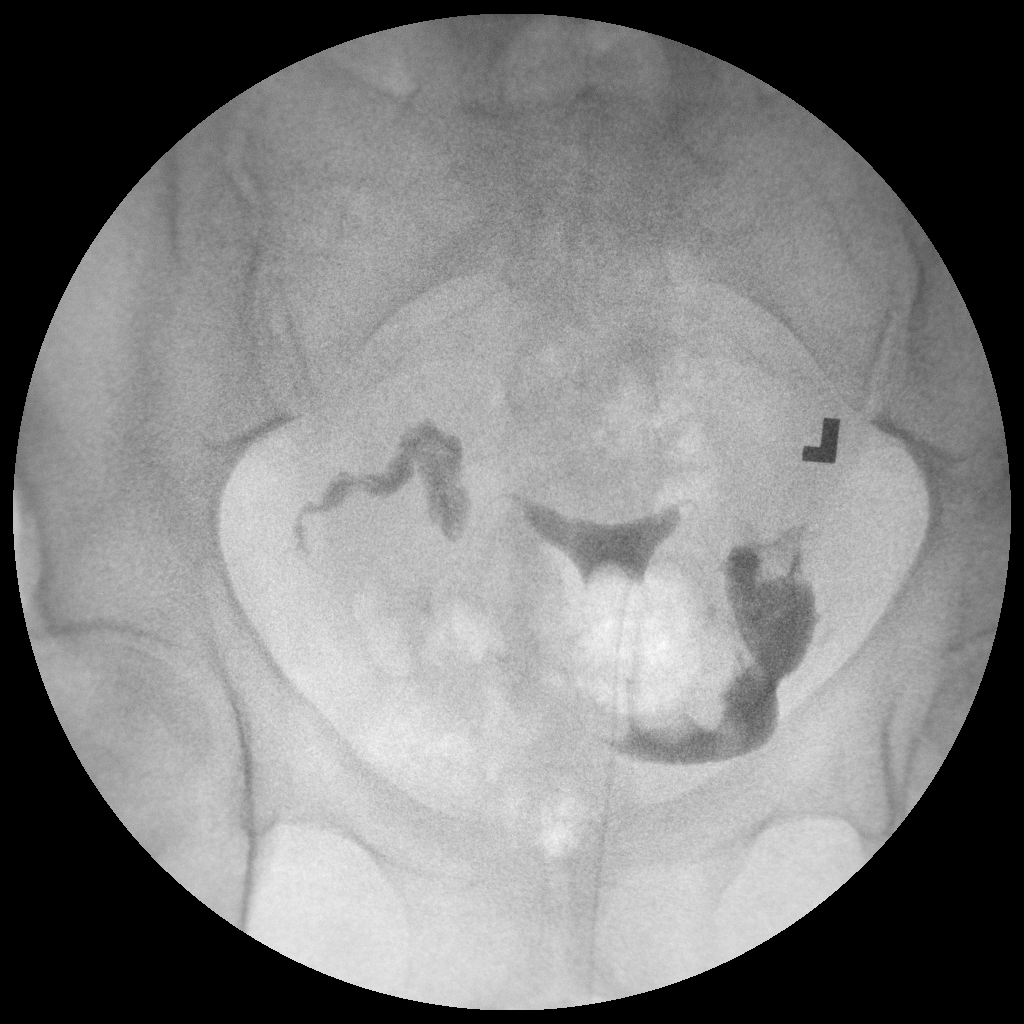

[Series 4: run · 1 of 1 slices shown (4 of 6)]
[im 1/1]
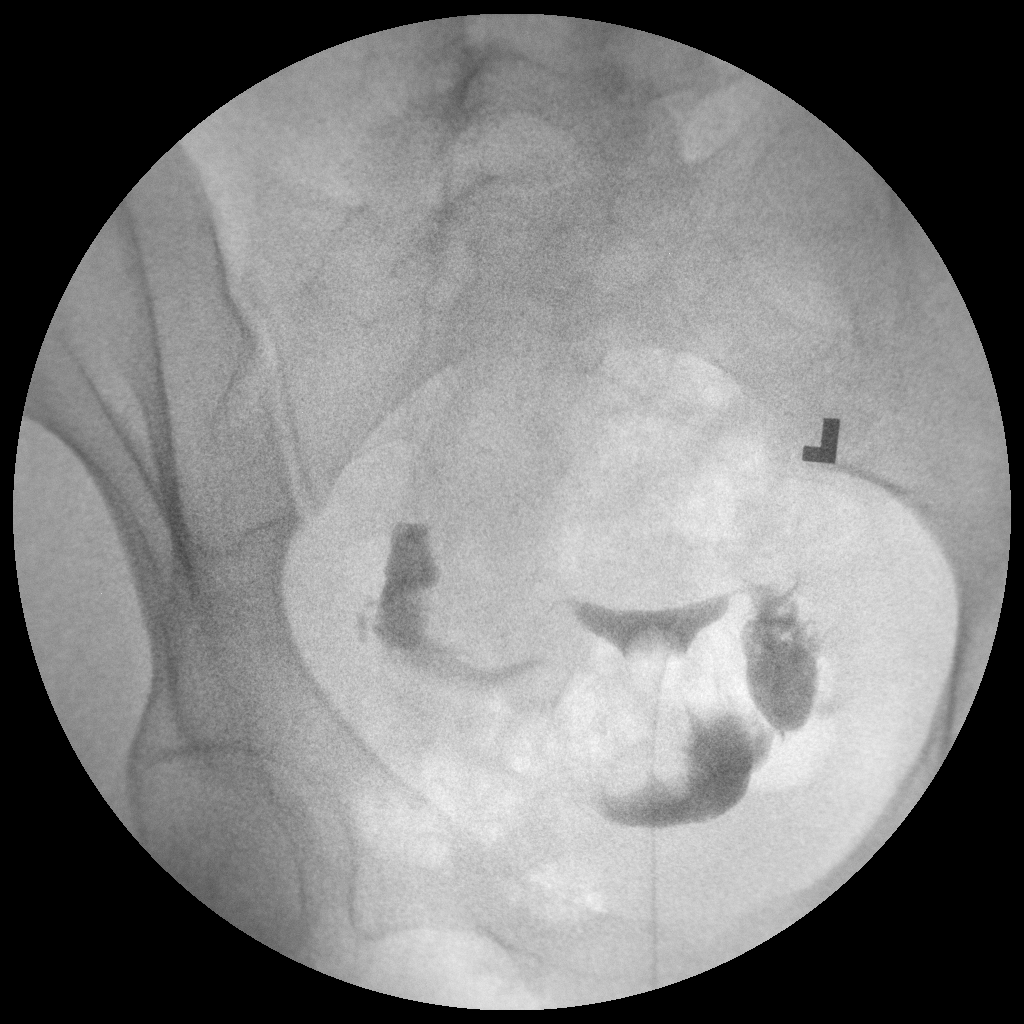

[Series 5: run · 1 of 1 slices shown (5 of 6)]
[im 1/1]
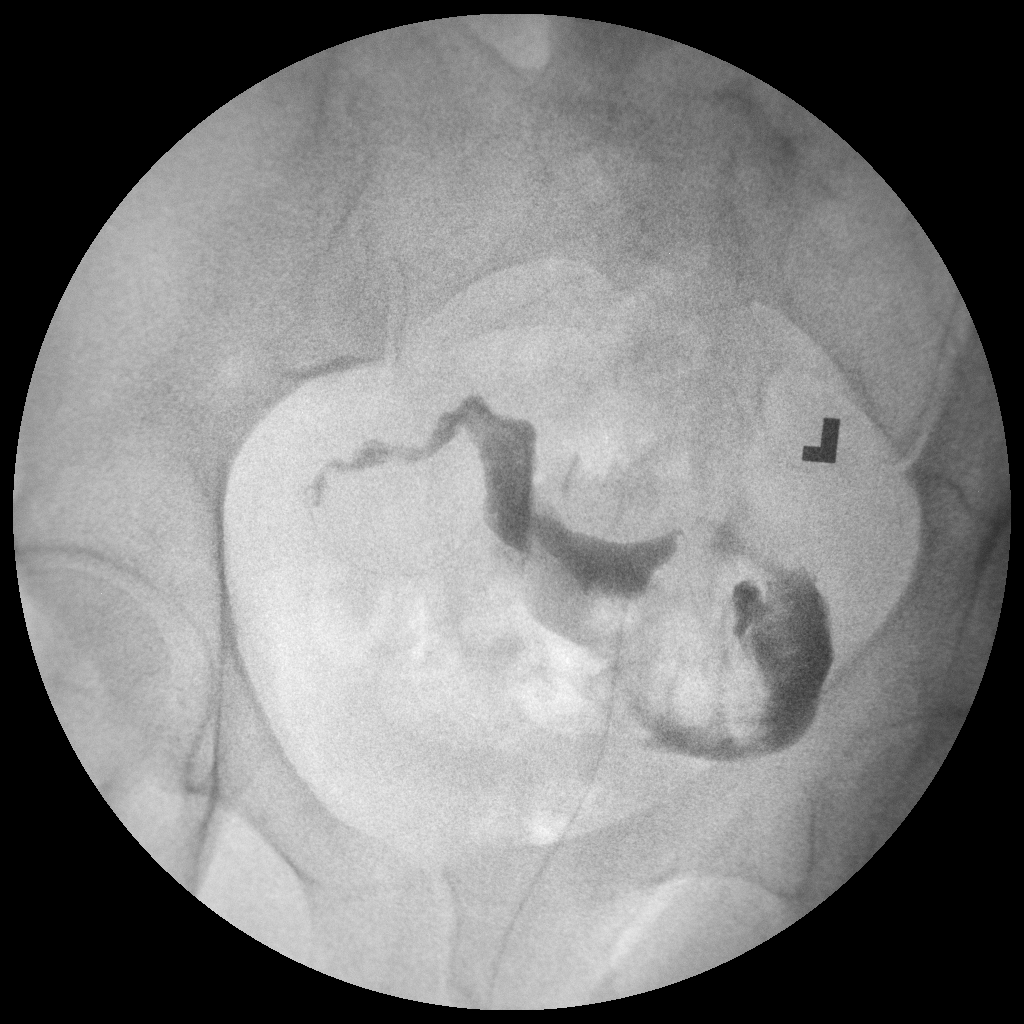

[Series 6: run · 1 of 1 slices shown (6 of 6)]
[im 1/1]
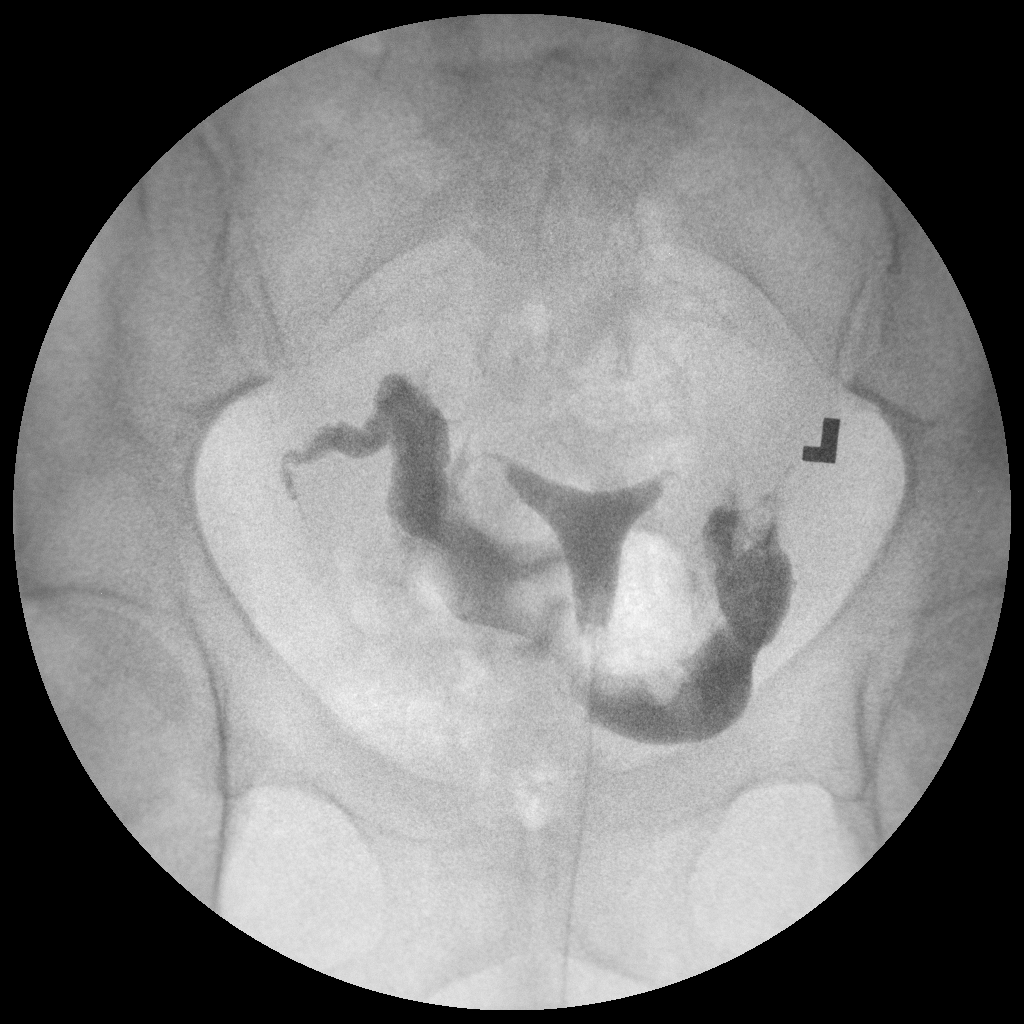

[6 of 6 positions shown; findings below may reference images not displayed]

FLUOROSCOPY TIME:  Fluoroscopy Time:  1 minutes 30 seconds

Number of Acquired Images:  0
FINDINGS: Endometrial Cavity: Normal appearance. No signs of Mullerian duct
anomaly or other significant abnormality.

Right Fallopian Tube: Normal appearance. Free intraperitoneal spill
of contrast is demonstrated.

Left Fallopian Tube: Normal appearance. Free intraperitoneal spill
of contrast is demonstrated.

Other:  None.
IMPRESSION: Normal study. Both fallopian tubes are patent.

## 2019-03-17 DIAGNOSIS — O3481 Maternal care for other abnormalities of pelvic organs, first trimester: Secondary | ICD-10-CM | POA: Diagnosis not present

## 2019-03-17 DIAGNOSIS — Z3A27 27 weeks gestation of pregnancy: Secondary | ICD-10-CM | POA: Diagnosis not present

## 2019-03-17 DIAGNOSIS — Z3689 Encounter for other specified antenatal screening: Secondary | ICD-10-CM | POA: Diagnosis not present

## 2019-03-17 DIAGNOSIS — O3480 Maternal care for other abnormalities of pelvic organs, unspecified trimester: Secondary | ICD-10-CM | POA: Diagnosis not present

## 2019-03-17 DIAGNOSIS — Z3682 Encounter for antenatal screening for nuchal translucency: Secondary | ICD-10-CM | POA: Diagnosis not present

## 2019-03-17 DIAGNOSIS — Z118 Encounter for screening for other infectious and parasitic diseases: Secondary | ICD-10-CM | POA: Diagnosis not present

## 2019-03-17 DIAGNOSIS — O09299 Supervision of pregnancy with other poor reproductive or obstetric history, unspecified trimester: Secondary | ICD-10-CM | POA: Diagnosis not present

## 2019-03-17 LAB — OB RESULTS CONSOLE ABO/RH: RH Type: POSITIVE

## 2019-03-17 LAB — OB RESULTS CONSOLE GC/CHLAMYDIA
Chlamydia: NEGATIVE
Gonorrhea: NEGATIVE

## 2019-03-17 LAB — OB RESULTS CONSOLE RPR: RPR: NONREACTIVE

## 2019-03-17 LAB — OB RESULTS CONSOLE HIV ANTIBODY (ROUTINE TESTING): HIV: NONREACTIVE

## 2019-03-17 LAB — OB RESULTS CONSOLE RUBELLA ANTIBODY, IGM: Rubella: IMMUNE

## 2019-03-17 LAB — OB RESULTS CONSOLE ANTIBODY SCREEN: Antibody Screen: NEGATIVE

## 2019-03-17 LAB — OB RESULTS CONSOLE HEPATITIS B SURFACE ANTIGEN: Hepatitis B Surface Ag: NEGATIVE

## 2019-03-20 DIAGNOSIS — F332 Major depressive disorder, recurrent severe without psychotic features: Secondary | ICD-10-CM | POA: Diagnosis not present

## 2019-03-21 DIAGNOSIS — F332 Major depressive disorder, recurrent severe without psychotic features: Secondary | ICD-10-CM | POA: Diagnosis not present

## 2019-03-22 DIAGNOSIS — F332 Major depressive disorder, recurrent severe without psychotic features: Secondary | ICD-10-CM | POA: Diagnosis not present

## 2019-03-23 DIAGNOSIS — F332 Major depressive disorder, recurrent severe without psychotic features: Secondary | ICD-10-CM | POA: Diagnosis not present

## 2019-03-24 DIAGNOSIS — O3481 Maternal care for other abnormalities of pelvic organs, first trimester: Secondary | ICD-10-CM | POA: Diagnosis not present

## 2019-03-24 DIAGNOSIS — Z3682 Encounter for antenatal screening for nuchal translucency: Secondary | ICD-10-CM | POA: Diagnosis not present

## 2019-03-24 DIAGNOSIS — F332 Major depressive disorder, recurrent severe without psychotic features: Secondary | ICD-10-CM | POA: Diagnosis not present

## 2019-03-24 DIAGNOSIS — O3441 Maternal care for other abnormalities of cervix, first trimester: Secondary | ICD-10-CM | POA: Diagnosis not present

## 2019-03-24 DIAGNOSIS — Z3A13 13 weeks gestation of pregnancy: Secondary | ICD-10-CM | POA: Diagnosis not present

## 2019-03-27 DIAGNOSIS — F332 Major depressive disorder, recurrent severe without psychotic features: Secondary | ICD-10-CM | POA: Diagnosis not present

## 2019-03-28 DIAGNOSIS — F332 Major depressive disorder, recurrent severe without psychotic features: Secondary | ICD-10-CM | POA: Diagnosis not present

## 2019-03-29 DIAGNOSIS — F332 Major depressive disorder, recurrent severe without psychotic features: Secondary | ICD-10-CM | POA: Diagnosis not present

## 2019-03-30 DIAGNOSIS — F332 Major depressive disorder, recurrent severe without psychotic features: Secondary | ICD-10-CM | POA: Diagnosis not present

## 2019-03-31 DIAGNOSIS — F332 Major depressive disorder, recurrent severe without psychotic features: Secondary | ICD-10-CM | POA: Diagnosis not present

## 2019-04-04 DIAGNOSIS — F332 Major depressive disorder, recurrent severe without psychotic features: Secondary | ICD-10-CM | POA: Diagnosis not present

## 2019-04-05 DIAGNOSIS — F332 Major depressive disorder, recurrent severe without psychotic features: Secondary | ICD-10-CM | POA: Diagnosis not present

## 2019-04-06 DIAGNOSIS — F332 Major depressive disorder, recurrent severe without psychotic features: Secondary | ICD-10-CM | POA: Diagnosis not present

## 2019-04-07 DIAGNOSIS — F332 Major depressive disorder, recurrent severe without psychotic features: Secondary | ICD-10-CM | POA: Diagnosis not present

## 2019-04-10 DIAGNOSIS — F332 Major depressive disorder, recurrent severe without psychotic features: Secondary | ICD-10-CM | POA: Diagnosis not present

## 2019-04-11 DIAGNOSIS — F332 Major depressive disorder, recurrent severe without psychotic features: Secondary | ICD-10-CM | POA: Diagnosis not present

## 2019-04-12 DIAGNOSIS — F332 Major depressive disorder, recurrent severe without psychotic features: Secondary | ICD-10-CM | POA: Diagnosis not present

## 2019-04-13 DIAGNOSIS — F332 Major depressive disorder, recurrent severe without psychotic features: Secondary | ICD-10-CM | POA: Diagnosis not present

## 2019-04-13 DIAGNOSIS — Z361 Encounter for antenatal screening for raised alphafetoprotein level: Secondary | ICD-10-CM | POA: Diagnosis not present

## 2019-04-13 DIAGNOSIS — Z3A16 16 weeks gestation of pregnancy: Secondary | ICD-10-CM | POA: Diagnosis not present

## 2019-04-13 DIAGNOSIS — O3442 Maternal care for other abnormalities of cervix, second trimester: Secondary | ICD-10-CM | POA: Diagnosis not present

## 2019-04-13 DIAGNOSIS — O3480 Maternal care for other abnormalities of pelvic organs, unspecified trimester: Secondary | ICD-10-CM | POA: Diagnosis not present

## 2019-04-13 DIAGNOSIS — O3482 Maternal care for other abnormalities of pelvic organs, second trimester: Secondary | ICD-10-CM | POA: Diagnosis not present

## 2019-04-18 DIAGNOSIS — F332 Major depressive disorder, recurrent severe without psychotic features: Secondary | ICD-10-CM | POA: Diagnosis not present

## 2019-04-19 DIAGNOSIS — F332 Major depressive disorder, recurrent severe without psychotic features: Secondary | ICD-10-CM | POA: Diagnosis not present

## 2019-04-20 DIAGNOSIS — Z3A17 17 weeks gestation of pregnancy: Secondary | ICD-10-CM | POA: Diagnosis not present

## 2019-04-20 DIAGNOSIS — O99810 Abnormal glucose complicating pregnancy: Secondary | ICD-10-CM | POA: Diagnosis not present

## 2019-04-20 DIAGNOSIS — F332 Major depressive disorder, recurrent severe without psychotic features: Secondary | ICD-10-CM | POA: Diagnosis not present

## 2019-04-21 DIAGNOSIS — F332 Major depressive disorder, recurrent severe without psychotic features: Secondary | ICD-10-CM | POA: Diagnosis not present

## 2019-04-21 DIAGNOSIS — O24419 Gestational diabetes mellitus in pregnancy, unspecified control: Secondary | ICD-10-CM | POA: Diagnosis not present

## 2019-04-21 DIAGNOSIS — Z3A17 17 weeks gestation of pregnancy: Secondary | ICD-10-CM | POA: Diagnosis not present

## 2019-04-21 DIAGNOSIS — Z3686 Encounter for antenatal screening for cervical length: Secondary | ICD-10-CM | POA: Diagnosis not present

## 2019-04-24 DIAGNOSIS — F332 Major depressive disorder, recurrent severe without psychotic features: Secondary | ICD-10-CM | POA: Diagnosis not present

## 2019-04-25 DIAGNOSIS — F332 Major depressive disorder, recurrent severe without psychotic features: Secondary | ICD-10-CM | POA: Diagnosis not present

## 2019-04-27 DIAGNOSIS — F332 Major depressive disorder, recurrent severe without psychotic features: Secondary | ICD-10-CM | POA: Diagnosis not present

## 2019-04-27 DIAGNOSIS — Z3A18 18 weeks gestation of pregnancy: Secondary | ICD-10-CM | POA: Diagnosis not present

## 2019-04-27 DIAGNOSIS — O24419 Gestational diabetes mellitus in pregnancy, unspecified control: Secondary | ICD-10-CM | POA: Diagnosis not present

## 2019-04-28 DIAGNOSIS — F332 Major depressive disorder, recurrent severe without psychotic features: Secondary | ICD-10-CM | POA: Diagnosis not present

## 2019-05-02 DIAGNOSIS — F332 Major depressive disorder, recurrent severe without psychotic features: Secondary | ICD-10-CM | POA: Diagnosis not present

## 2019-05-03 ENCOUNTER — Ambulatory Visit: Payer: BC Managed Care – PPO | Admitting: Registered"

## 2019-05-03 DIAGNOSIS — F332 Major depressive disorder, recurrent severe without psychotic features: Secondary | ICD-10-CM | POA: Diagnosis not present

## 2019-05-04 DIAGNOSIS — F332 Major depressive disorder, recurrent severe without psychotic features: Secondary | ICD-10-CM | POA: Diagnosis not present

## 2019-05-05 DIAGNOSIS — O09512 Supervision of elderly primigravida, second trimester: Secondary | ICD-10-CM | POA: Diagnosis not present

## 2019-05-05 DIAGNOSIS — Z3A19 19 weeks gestation of pregnancy: Secondary | ICD-10-CM | POA: Diagnosis not present

## 2019-05-05 DIAGNOSIS — O24419 Gestational diabetes mellitus in pregnancy, unspecified control: Secondary | ICD-10-CM | POA: Diagnosis not present

## 2019-05-08 DIAGNOSIS — F332 Major depressive disorder, recurrent severe without psychotic features: Secondary | ICD-10-CM | POA: Diagnosis not present

## 2019-05-09 DIAGNOSIS — F332 Major depressive disorder, recurrent severe without psychotic features: Secondary | ICD-10-CM | POA: Diagnosis not present

## 2019-05-10 DIAGNOSIS — F332 Major depressive disorder, recurrent severe without psychotic features: Secondary | ICD-10-CM | POA: Diagnosis not present

## 2019-05-12 DIAGNOSIS — F332 Major depressive disorder, recurrent severe without psychotic features: Secondary | ICD-10-CM | POA: Diagnosis not present

## 2019-05-16 DIAGNOSIS — Z3A20 20 weeks gestation of pregnancy: Secondary | ICD-10-CM | POA: Diagnosis not present

## 2019-05-16 DIAGNOSIS — O24419 Gestational diabetes mellitus in pregnancy, unspecified control: Secondary | ICD-10-CM | POA: Diagnosis not present

## 2019-05-16 DIAGNOSIS — F332 Major depressive disorder, recurrent severe without psychotic features: Secondary | ICD-10-CM | POA: Diagnosis not present

## 2019-05-17 DIAGNOSIS — F332 Major depressive disorder, recurrent severe without psychotic features: Secondary | ICD-10-CM | POA: Diagnosis not present

## 2019-05-18 DIAGNOSIS — F332 Major depressive disorder, recurrent severe without psychotic features: Secondary | ICD-10-CM | POA: Diagnosis not present

## 2019-05-23 DIAGNOSIS — O09512 Supervision of elderly primigravida, second trimester: Secondary | ICD-10-CM | POA: Diagnosis not present

## 2019-05-23 DIAGNOSIS — O26892 Other specified pregnancy related conditions, second trimester: Secondary | ICD-10-CM | POA: Diagnosis not present

## 2019-05-23 DIAGNOSIS — O3442 Maternal care for other abnormalities of cervix, second trimester: Secondary | ICD-10-CM | POA: Diagnosis not present

## 2019-05-23 DIAGNOSIS — Z3A21 21 weeks gestation of pregnancy: Secondary | ICD-10-CM | POA: Diagnosis not present

## 2019-05-25 DIAGNOSIS — O24410 Gestational diabetes mellitus in pregnancy, diet controlled: Secondary | ICD-10-CM | POA: Diagnosis not present

## 2019-05-25 DIAGNOSIS — Z3A22 22 weeks gestation of pregnancy: Secondary | ICD-10-CM | POA: Diagnosis not present

## 2019-06-13 DIAGNOSIS — O09292 Supervision of pregnancy with other poor reproductive or obstetric history, second trimester: Secondary | ICD-10-CM | POA: Diagnosis not present

## 2019-06-13 DIAGNOSIS — O09512 Supervision of elderly primigravida, second trimester: Secondary | ICD-10-CM | POA: Diagnosis not present

## 2019-06-13 DIAGNOSIS — Z3A24 24 weeks gestation of pregnancy: Secondary | ICD-10-CM | POA: Diagnosis not present

## 2019-07-11 DIAGNOSIS — Z23 Encounter for immunization: Secondary | ICD-10-CM | POA: Diagnosis not present

## 2019-07-11 DIAGNOSIS — O99013 Anemia complicating pregnancy, third trimester: Secondary | ICD-10-CM | POA: Diagnosis not present

## 2019-07-11 DIAGNOSIS — Z3A28 28 weeks gestation of pregnancy: Secondary | ICD-10-CM | POA: Diagnosis not present

## 2019-07-11 DIAGNOSIS — Z3689 Encounter for other specified antenatal screening: Secondary | ICD-10-CM | POA: Diagnosis not present

## 2019-07-11 DIAGNOSIS — O24419 Gestational diabetes mellitus in pregnancy, unspecified control: Secondary | ICD-10-CM | POA: Diagnosis not present

## 2019-07-25 DIAGNOSIS — O24419 Gestational diabetes mellitus in pregnancy, unspecified control: Secondary | ICD-10-CM | POA: Diagnosis not present

## 2019-07-25 DIAGNOSIS — Z3A30 30 weeks gestation of pregnancy: Secondary | ICD-10-CM | POA: Diagnosis not present

## 2019-07-25 DIAGNOSIS — Z23 Encounter for immunization: Secondary | ICD-10-CM | POA: Diagnosis not present

## 2019-07-25 DIAGNOSIS — O99013 Anemia complicating pregnancy, third trimester: Secondary | ICD-10-CM | POA: Diagnosis not present

## 2019-08-08 DIAGNOSIS — O09513 Supervision of elderly primigravida, third trimester: Secondary | ICD-10-CM | POA: Diagnosis not present

## 2019-08-08 DIAGNOSIS — Z3A32 32 weeks gestation of pregnancy: Secondary | ICD-10-CM | POA: Diagnosis not present

## 2019-08-08 DIAGNOSIS — O24419 Gestational diabetes mellitus in pregnancy, unspecified control: Secondary | ICD-10-CM | POA: Diagnosis not present

## 2019-08-08 DIAGNOSIS — O3663X0 Maternal care for excessive fetal growth, third trimester, not applicable or unspecified: Secondary | ICD-10-CM | POA: Diagnosis not present

## 2019-08-24 DIAGNOSIS — O3663X0 Maternal care for excessive fetal growth, third trimester, not applicable or unspecified: Secondary | ICD-10-CM | POA: Diagnosis not present

## 2019-08-24 DIAGNOSIS — O09513 Supervision of elderly primigravida, third trimester: Secondary | ICD-10-CM | POA: Diagnosis not present

## 2019-08-24 DIAGNOSIS — O24419 Gestational diabetes mellitus in pregnancy, unspecified control: Secondary | ICD-10-CM | POA: Diagnosis not present

## 2019-08-24 DIAGNOSIS — Z3685 Encounter for antenatal screening for Streptococcus B: Secondary | ICD-10-CM | POA: Diagnosis not present

## 2019-08-24 DIAGNOSIS — Z3A35 35 weeks gestation of pregnancy: Secondary | ICD-10-CM | POA: Diagnosis not present

## 2019-08-24 LAB — OB RESULTS CONSOLE GBS: GBS: NEGATIVE

## 2019-08-30 DIAGNOSIS — O09513 Supervision of elderly primigravida, third trimester: Secondary | ICD-10-CM | POA: Diagnosis not present

## 2019-08-30 DIAGNOSIS — Z3A35 35 weeks gestation of pregnancy: Secondary | ICD-10-CM | POA: Diagnosis not present

## 2019-08-30 DIAGNOSIS — O3663X0 Maternal care for excessive fetal growth, third trimester, not applicable or unspecified: Secondary | ICD-10-CM | POA: Diagnosis not present

## 2019-09-06 ENCOUNTER — Encounter: Payer: Self-pay | Admitting: Obstetrics

## 2019-09-06 DIAGNOSIS — Z3A36 36 weeks gestation of pregnancy: Secondary | ICD-10-CM | POA: Diagnosis not present

## 2019-09-06 DIAGNOSIS — O364XX0 Maternal care for intrauterine death, not applicable or unspecified: Secondary | ICD-10-CM | POA: Diagnosis not present

## 2019-09-06 NOTE — H&P (Signed)
Katrina Phillips is a 38 y.o. G1P0010 at 37'0 on 09/07/19, by IUI c/w 6 and 9 wk u/s presenting for cytotec IOL due to IUFD. Pt presented for routine OB visit on 11/25 and found to have unexplained IUFD. Normal u/s 2 wks ago, normal exam with active FHT 1 wk ago.  Pt notes no contractions. No fetal movement, No vaginal bleeding, not leaking fluid.  P[facility] at [facility] since 6 wks - Dated by IUI c/w 6 and 9 wk u/s - A1GDM, early GTT done for h/o PCOS with GDM diagnosis in 2nd trimester. BS checks throughout pregnancy have been normal. Normal fetal echo.Normal fetal growth. Low maternal wt gain- 7# - AMA, nl NT, nl AFP - depression/ anxiety, on 40mg  Prozac through pregnancy. Has psychiatrist, used transcranial magnetic stimulation to treat mood in 1st and 2nd trimester - h/o infertilty due to PCOS, IUI preg, also on metformin during fertility treatment - Arcuate uterus - LEEP in 2019. Cervical shortening at 16 wks, 1.9 cm, placed on bedrest and vaginal prometrium with f/u u/s CL 1 wk later at 2.5 cm then to 3.9 cm at 18 wks. Prometrium continued but pt taken off bed rest.  - fetal growth 11/12: 6'4, 79%, vtx, post placenta, AFI 13   Prenatal Transfer Tool  Maternal Diabetes: Yes:  Diabetes Type:  Diet controlled Genetic Screening: Normal Maternal Ultrasounds/Referrals: Normal Fetal Ultrasounds or other Referrals:  Fetal echo Maternal Substance Abuse:  No Significant Maternal Medications:  Meds include: Prozac Significant Maternal Lab Results: Group B Strep negative     OB History    Gravida  1   Para  0   Term  0   Preterm  0   AB  1   Living  0     SAB  0   TAB  1   Ectopic  0   Multiple  0   Live Births  0          Past Medical History:  Diagnosis Date  . Abnormal Pap smear of cervix 03/15/2017   HR HPV: +Detected  . ADHD   . Allergy    SEASONAL  . Anxiety   . Asthma    AS A CHILD  . Depression   . H/O concussion 1995   Past Surgical History:   Procedure Laterality Date  . COLPOSCOPY  2006  . WISDOM TOOTH EXTRACTION     Family History: family history includes ADD / ADHD in her brother, mother, and sister; Celiac disease in her father, paternal grandmother, and paternal uncle; Depression in her brother, father, and maternal grandmother; GER disease in her maternal grandfather and mother; Heart disease in her maternal grandfather; Kidney disease in her paternal grandmother; Melanoma in her paternal aunt. Social History:  reports that she has never smoked. She has never used smokeless tobacco. She reports current alcohol use of about 1.0 - 4.0 standard drinks of alcohol per week. She reports that she does not use drugs.  Review of Systems - Negative except IUFD, sadness   Prenatal labs: ABO, Rh:  A+ Antibody:  neg Rubella:  immune RPR:   NR HBsAg:   neg HIV:   neg GBS:   neg 1 hr Glucola 140  Genetic screening normal NT, nl AFP Anatomy 2007 normal   Assessment/Plan: 38 y.o. G1P0010 at 37'0 with A1GDM, AGA baby and IUFD diagnosed at 36'6 - IOL. D/w pt plan for IOL with cytotec and varying times to delivery: 12 hr to 3days, Pt aware  we are planning for a vaginal delivery (she will need to push) and pain options of IV pain meds available; recc epidural.  possibility of developing fever or temp from cytotec and will usually require abx as unable to differentiate med effect vs infection. Risk of retained placenta d/w pt. Pt asked about option of PCS but recommended against this. Pt and husband will want to see and hold baby and they would like to proceed with autopsy and chromosomal testing. Will plan PP thrombophilia testing but add A1C and TSH to admission labs. Plan of care reviewed in detail. Pt would like to proceed in am. On call MDs aware and will manage primarily.   - SW consult PP, pt interested in support groups.   Ala Dach 09/06/2019, 5:37 PM

## 2019-09-07 ENCOUNTER — Encounter (HOSPITAL_COMMUNITY): Payer: Self-pay | Admitting: *Deleted

## 2019-09-07 ENCOUNTER — Other Ambulatory Visit: Payer: Self-pay

## 2019-09-07 ENCOUNTER — Inpatient Hospital Stay (HOSPITAL_COMMUNITY)
Admission: AD | Admit: 2019-09-07 | Discharge: 2019-09-08 | DRG: 807 | Disposition: A | Discharge status: Home / Self Care | Payer: BLUE CROSS/BLUE SHIELD | Attending: Obstetrics & Gynecology | Admitting: Obstetrics & Gynecology

## 2019-09-07 DIAGNOSIS — O3403 Maternal care for unspecified congenital malformation of uterus, third trimester: Secondary | ICD-10-CM | POA: Diagnosis not present

## 2019-09-07 DIAGNOSIS — O99344 Other mental disorders complicating childbirth: Secondary | ICD-10-CM | POA: Diagnosis present

## 2019-09-07 DIAGNOSIS — O24420 Gestational diabetes mellitus in childbirth, diet controlled: Secondary | ICD-10-CM | POA: Diagnosis present

## 2019-09-07 DIAGNOSIS — Z20828 Contact with and (suspected) exposure to other viral communicable diseases: Secondary | ICD-10-CM | POA: Diagnosis present

## 2019-09-07 DIAGNOSIS — O364XX0 Maternal care for intrauterine death, not applicable or unspecified: Principal | ICD-10-CM | POA: Diagnosis present

## 2019-09-07 DIAGNOSIS — F329 Major depressive disorder, single episode, unspecified: Secondary | ICD-10-CM | POA: Diagnosis present

## 2019-09-07 DIAGNOSIS — Q51810 Arcuate uterus: Secondary | ICD-10-CM | POA: Diagnosis not present

## 2019-09-07 DIAGNOSIS — O9952 Diseases of the respiratory system complicating childbirth: Secondary | ICD-10-CM | POA: Diagnosis not present

## 2019-09-07 DIAGNOSIS — F419 Anxiety disorder, unspecified: Secondary | ICD-10-CM | POA: Diagnosis not present

## 2019-09-07 DIAGNOSIS — J45909 Unspecified asthma, uncomplicated: Secondary | ICD-10-CM | POA: Diagnosis not present

## 2019-09-07 DIAGNOSIS — Z3A37 37 weeks gestation of pregnancy: Secondary | ICD-10-CM

## 2019-09-07 LAB — CBC
HCT: 33.3 % — ABNORMAL LOW (ref 36.0–46.0)
HCT: 34.4 % — ABNORMAL LOW (ref 36.0–46.0)
Hemoglobin: 11.4 g/dL — ABNORMAL LOW (ref 12.0–15.0)
Hemoglobin: 11.8 g/dL — ABNORMAL LOW (ref 12.0–15.0)
MCH: 32.7 pg (ref 26.0–34.0)
MCH: 32.8 pg (ref 26.0–34.0)
MCHC: 34.2 g/dL (ref 30.0–36.0)
MCHC: 34.3 g/dL (ref 30.0–36.0)
MCV: 95.4 fL (ref 80.0–100.0)
MCV: 95.6 fL (ref 80.0–100.0)
Platelets: 294 10*3/uL (ref 150–400)
Platelets: 280 10*3/uL (ref 150–400)
RBC: 3.49 MIL/uL — ABNORMAL LOW (ref 3.87–5.11)
RBC: 3.6 MIL/uL — ABNORMAL LOW (ref 3.87–5.11)
RDW: 12.6 % (ref 11.5–15.5)
RDW: 12.5 % (ref 11.5–15.5)
WBC: 11.9 10*3/uL — ABNORMAL HIGH (ref 4.0–10.5)
WBC: 15 10*3/uL — ABNORMAL HIGH (ref 4.0–10.5)
nRBC: 0 % (ref 0.0–0.2)
nRBC: 0 % (ref 0.0–0.2)

## 2019-09-07 LAB — TSH: TSH: 2.599 u[IU]/mL (ref 0.350–4.500)

## 2019-09-07 LAB — PROTIME-INR
INR: 1 (ref 0.8–1.2)
Prothrombin Time: 13.4 seconds (ref 11.4–15.2)

## 2019-09-07 LAB — FIBRINOGEN: Fibrinogen: 524 mg/dL — ABNORMAL HIGH (ref 210–475)

## 2019-09-07 LAB — HEMOGLOBIN A1C
Hgb A1c MFr Bld: 5.4 % (ref 4.8–5.6)
Mean Plasma Glucose: 108.28 mg/dL

## 2019-09-07 LAB — TYPE AND SCREEN
ABO/RH(D): A POS
Antibody Screen: NEGATIVE

## 2019-09-07 LAB — APTT: aPTT: 27 seconds (ref 24–36)

## 2019-09-07 LAB — SARS CORONAVIRUS 2 (TAT 6-24 HRS): SARS Coronavirus 2: NEGATIVE

## 2019-09-07 LAB — ABO/RH: ABO/RH(D): A POS

## 2019-09-07 MED ORDER — PHENYLEPHRINE 40 MCG/ML (10ML) SYRINGE FOR IV PUSH (FOR BLOOD PRESSURE SUPPORT): 80.0000 ug | PREFILLED_SYRINGE | INTRAVENOUS | Status: DC | PRN

## 2019-09-07 MED ORDER — EPHEDRINE 5 MG/ML INJ: 10.0000 mg | INTRAVENOUS | Status: DC | PRN

## 2019-09-07 MED ORDER — ONDANSETRON HCL 4 MG/2ML IJ SOLN: 4.0000 mg | Freq: Four times a day (QID) | INTRAMUSCULAR | Status: DC | PRN

## 2019-09-07 MED ORDER — MISOPROSTOL 200 MCG PO TABS
200.0000 ug | ORAL_TABLET | ORAL | Status: AC | PRN
  Administered 2019-09-07 (×3): 200 ug via VAGINAL
  Filled 2019-09-07 (×3): qty 1

## 2019-09-07 MED ORDER — OXYCODONE-ACETAMINOPHEN 5-325 MG PO TABS: 2.0000 | ORAL_TABLET | ORAL | Status: DC | PRN

## 2019-09-07 MED ORDER — OXYCODONE-ACETAMINOPHEN 5-325 MG PO TABS: 1.0000 | ORAL_TABLET | ORAL | Status: DC | PRN

## 2019-09-07 MED ORDER — ACETAMINOPHEN 325 MG PO TABS
650.0000 mg | ORAL_TABLET | ORAL | Status: DC | PRN
  Administered 2019-09-07: 650 mg via ORAL
  Filled 2019-09-07: qty 2

## 2019-09-07 MED ORDER — SOD CITRATE-CITRIC ACID 500-334 MG/5ML PO SOLN: 30.0000 mL | ORAL | Status: DC | PRN

## 2019-09-07 MED ORDER — LACTATED RINGERS IV SOLN
INTRAVENOUS | Status: DC
  Administered 2019-09-07 – 2019-09-08 (×4): via INTRAVENOUS

## 2019-09-07 MED ORDER — LACTATED RINGERS IV SOLN
500.0000 mL | Freq: Once | INTRAVENOUS | Status: AC
  Administered 2019-09-08: 500 mL via INTRAVENOUS

## 2019-09-07 MED ORDER — ALPRAZOLAM 0.5 MG PO TABS
0.5000 mg | ORAL_TABLET | ORAL | Status: DC | PRN
  Administered 2019-09-07: 0.5 mg via ORAL
  Filled 2019-09-07 (×2): qty 1

## 2019-09-07 MED ORDER — OXYTOCIN 40 UNITS IN NORMAL SALINE INFUSION - SIMPLE MED
2.5000 [IU]/h | INTRAVENOUS | Status: DC
  Filled 2019-09-07: qty 1000

## 2019-09-07 MED ORDER — LACTATED RINGERS IV SOLN: 500.0000 mL | INTRAVENOUS | Status: DC | PRN

## 2019-09-07 MED ORDER — MISOPROSTOL 200 MCG PO TABS
200.0000 ug | ORAL_TABLET | Freq: Once | ORAL | Status: AC
  Administered 2019-09-07: 200 ug via BUCCAL
  Filled 2019-09-07: qty 1

## 2019-09-07 MED ORDER — DIPHENHYDRAMINE HCL 50 MG/ML IJ SOLN: 12.5000 mg | INTRAMUSCULAR | Status: DC | PRN

## 2019-09-07 MED ORDER — LIDOCAINE HCL (PF) 1 % IJ SOLN: 30.0000 mL | INTRAMUSCULAR | Status: DC | PRN

## 2019-09-07 MED ORDER — FENTANYL-BUPIVACAINE-NACL 0.5-0.125-0.9 MG/250ML-% EP SOLN
12.0000 mL/h | EPIDURAL | Status: DC | PRN
  Filled 2019-09-07: qty 250

## 2019-09-07 MED ORDER — FENTANYL CITRATE (PF) 100 MCG/2ML IJ SOLN: 100.0000 ug | INTRAMUSCULAR | Status: DC | PRN

## 2019-09-07 MED ORDER — OXYTOCIN BOLUS FROM INFUSION
500.0000 mL | Freq: Once | INTRAVENOUS | Status: AC
  Administered 2019-09-08: 500 mL via INTRAVENOUS

## 2019-09-07 NOTE — H&P (Addendum)
Katrina Phillips is a 38 y.o. G1P0010 at 37'0 on 09/07/19, by IUI c/w 6 and 9 wk u/s presenting for cytotec IOL due to IUFD. Pt presented for routine OB visit on 11/25 and found to have unexplained IUFD. Normal u/s 2 wks ago, normal exam with active FHT 1 wk ago.  Pt notes no contractions. No fetal movement, No vaginal bleeding, not leaking fluid.  PNCare at [facility] since 6 wks - Dated by IUI c/w 6 and 9 wk u/s - A1GDM, early GTT done for h/o PCOS with GDM diagnosis in 2nd trimester. BS checks throughout pregnancy have been normal. Normal fetal echo.Normal fetal growth. Low maternal wt gain- 7# - AMA, nl NT, nl AFP - depression/ anxiety, on 40mg  Prozac through pregnancy. Has psychiatrist, used transcranial magnetic stimulation to treat mood in 1st and 2nd trimester - h/o infertilty due to PCOS, IUI preg, also on metformin during fertility treatment - Arcuate uterus - LEEP in 2019. Cervical shortening at 16 wks, 1.9 cm, placed on bedrest and vaginal prometrium with f/u u/s CL 1 wk later at 2.5 cm then to 3.9 cm at 18 wks. Prometrium continued but pt taken off bed rest.  - fetal growth 11/12: 6'4, 79%, vtx, post placenta, AFI 13   Prenatal Transfer Tool  Maternal Diabetes: Yes:  Diabetes Type:  Diet controlled Genetic Screening: Normal Maternal Ultrasounds/Referrals: Normal Fetal Ultrasounds or other Referrals:  Fetal echo Maternal Substance Abuse:  No Significant Maternal Medications:  Meds include: Prozac Significant Maternal Lab Results: Group B Strep negative     OB History    Gravida  2   Para  0   Term  0   Preterm  0   AB  1   Living  0     SAB  0   TAB  1   Ectopic  0   Multiple  0   Live Births  0          Past Medical History:  Diagnosis Date  . Abnormal Pap smear of cervix 03/15/2017   HR HPV: +Detected  . ADHD   . Allergy    SEASONAL  . Anxiety   . Asthma    AS A CHILD  . Depression   . H/O concussion 1995   Past Surgical History:   Procedure Laterality Date  . COLPOSCOPY  2006  . NO PAST SURGERIES    . WISDOM TOOTH EXTRACTION     Family History: family history includes ADD / ADHD in her brother, mother, and sister; Celiac disease in her father, paternal grandmother, and paternal uncle; Depression in her brother, father, and maternal grandmother; GER disease in her maternal grandfather and mother; Heart disease in her maternal grandfather; Kidney disease in her paternal grandmother; Melanoma in her paternal aunt. Social History:  reports that she has never smoked. She has never used smokeless tobacco. She reports current alcohol use of about 1.0 - 4.0 standard drinks of alcohol per week. She reports that she does not use drugs.  Review of Systems - Negative except IUFD, sadness  PE: BP 127/81   Pulse (!) 120   Temp 98.1 F (36.7 C) (Oral)   Resp 20   HEENT: nl  Neck: supple CV: RRR Lungs: CTA ABD: gravid NT No CVAT EXT: neg c/c/e Pelvic: cl/50/OOP- vertex by sono Neuro: nonfocal Skin ingact  Prenatal labs: ABO, Rh: --/--/PENDING (11/26 0900)A+ Antibody: PENDING (11/26 0900)neg Rubella: Immune (06/05 0000)immune RPR: Nonreactive (06/05 0000) NR HBsAg: Negative (06/05 0000)  neg HIV: Non-reactive (06/05 0000) neg GBS: Negative/-- (11/12 0000) neg 1 hr Glucola 140  Genetic screening normal NT, nl AFP Anatomy US normal   Assessment/Plan: 38 y.o. G1P0010 at 37'0 with A1GDM, AGA baby and IUFD diagnosed at 36'6 - IOL. D/w pt plan for IOL with cytotec and varying times to delivery: 12 hr to 3days, Pt aware we are planning for a vaginal delivery (she will need to push) and pain options of IV pain meds available; recc epidural.  possibility of developing fever or temp from cytotec and will usually require abx as unable to differentiate med effect vs infection. Risk of retained placenta d/w pt. Pt asked about option of PCS but recommended against this. Pt and husband will want to see and hold baby and they would  like to proceed with autopsy and chromosomal testing. Will plan PP thrombophilia testing but add A1C and TSH to admission labs. Plan of care reviewed in detail. Pt would like to proceed.  - SW consult PP, pt interested in support groups.   Terika Pillard J 09/07/2019, 10:11 AM

## 2019-09-07 NOTE — Progress Notes (Signed)
CSW received consult due to IUFD.  CSW available for support secondary to Spiritual Care Services and will await call from Chaplain before becoming involved.  CSW screening out referral at this time.  Missie Gehrig Boyd-Gilyard, MSW, LCSW Clinical Social Work [phone]  

## 2019-09-07 NOTE — Progress Notes (Signed)
I offered grief support to Katrina Phillips as they continue to process the loss of their baby.  They have good family support, Katrina Phillips's family lives in  [place] and Welty family lives in [place].  They are trying to make a decision about when their family should come, including Katrina Phillips's sister who had a baby over the summer that Katrina Phillips has not yet met.  I normalized the mix of emotions she feels about meeting her niece at this time. Katrina Phillips is Jewish and plans to reach out to his rabbi as well. I let them know that we are here to support them through this process and encouraged them to let us know their needs throughout their time here.   [place], [place] Pager, [phone] 10:51 AM    09/07/19 1000  Clinical Encounter Type  Visited With Patient and family together  Visit Type Spiritual support  Spiritual Encounters  Spiritual Needs Emotional;Grief support  Stress Factors  Patient Stress Factors Loss

## 2019-09-07 NOTE — Progress Notes (Signed)
Offered support throughout the day.  The on-call chaplain is aware of patient and available. Please page as needs arise.  [place], [place] Pager, [phone] 3:51 PM

## 2019-09-07 NOTE — Progress Notes (Addendum)
IOL for IUFD 37 wks,   Subjective: Period like cramps.   Objective: BP 124/71   Pulse 88   Temp 98.4 F (36.9 C)   Resp 18   Ht 5\' 9"  (1.753 m)   Wt 88 kg   BMI 28.65 kg/m   FHT: Absent UC:  Reports as period cramps  SVE:  2-3/70%/-2/ Vx. Intact membranes  Cook[place] Cervical Foley placed, bloody show with exam.    Assessment / Plan: G2P0010, IOL for IUFD at 34 wks. Cytotec 200 mcg every 4 hrs and is now likely to enter active phase. Cervical foley. Continue Cytotec one more dose.  Pain mngmt reviewed including sleep aid option. Epidural on request. CBC, PT/PTT/INR/Fibrinogen now.  Anticipate SVD  A1GDM well controlled  Elveria Royals 09/07/2019, 9:54 PM

## 2019-09-08 ENCOUNTER — Inpatient Hospital Stay (HOSPITAL_COMMUNITY): Payer: BLUE CROSS/BLUE SHIELD | Admitting: Anesthesiology

## 2019-09-08 ENCOUNTER — Encounter (HOSPITAL_COMMUNITY): Payer: Self-pay | Admitting: *Deleted

## 2019-09-08 DIAGNOSIS — O364XX0 Maternal care for intrauterine death, not applicable or unspecified: Secondary | ICD-10-CM | POA: Diagnosis present

## 2019-09-08 LAB — RPR: RPR Ser Ql: NONREACTIVE

## 2019-09-08 MED ORDER — ALPRAZOLAM 0.5 MG PO TABS: 0.5000 mg | ORAL_TABLET | Freq: Two times a day (BID) | ORAL | Status: DC | PRN

## 2019-09-08 MED ORDER — FLUOXETINE HCL 20 MG PO TABS: 40.0000 mg | ORAL_TABLET | Freq: Every day | ORAL | Status: DC

## 2019-09-08 MED ORDER — SODIUM CHLORIDE (PF) 0.9 % IJ SOLN
INTRAMUSCULAR | Status: DC | PRN
  Administered 2019-09-08: 12 mL/h via EPIDURAL

## 2019-09-08 MED ORDER — ACETAMINOPHEN 325 MG PO TABS
650.0000 mg | ORAL_TABLET | ORAL | Status: DC | PRN
  Administered 2019-09-08: 650 mg via ORAL
  Filled 2019-09-08: qty 2

## 2019-09-08 MED ORDER — TERBUTALINE SULFATE 1 MG/ML IJ SOLN: 0.2500 mg | Freq: Once | INTRAMUSCULAR | Status: DC | PRN

## 2019-09-08 MED ORDER — ZOLPIDEM TARTRATE 5 MG PO TABS: 5.0000 mg | ORAL_TABLET | Freq: Every evening | ORAL | Status: DC | PRN

## 2019-09-08 MED ORDER — OXYTOCIN 40 UNITS IN NORMAL SALINE INFUSION - SIMPLE MED
1.0000 m[IU]/min | INTRAVENOUS | Status: DC
  Administered 2019-09-08: 8 m[IU]/min via INTRAVENOUS
  Administered 2019-09-08: 4 m[IU]/min via INTRAVENOUS

## 2019-09-08 MED ORDER — PRENATAL MULTIVITAMIN CH: 1.0000 | ORAL_TABLET | Freq: Every day | ORAL | Status: DC

## 2019-09-08 MED ORDER — IBUPROFEN 800 MG PO TABS: 800.0000 mg | ORAL_TABLET | Freq: Three times a day (TID) | ORAL | 6 refills | Status: DC | PRN

## 2019-09-08 MED ORDER — DIPHENHYDRAMINE HCL 25 MG PO CAPS: 25.0000 mg | ORAL_CAPSULE | Freq: Four times a day (QID) | ORAL | Status: DC | PRN

## 2019-09-08 MED ORDER — TETANUS-DIPHTH-ACELL PERTUSSIS 5-2.5-18.5 LF-MCG/0.5 IM SUSP: 0.5000 mL | Freq: Once | INTRAMUSCULAR | Status: DC

## 2019-09-08 MED ORDER — BENZOCAINE-MENTHOL 20-0.5 % EX AERO
1.0000 "application " | INHALATION_SPRAY | CUTANEOUS | Status: DC | PRN
  Filled 2019-09-08: qty 56

## 2019-09-08 MED ORDER — IBUPROFEN 600 MG PO TABS
600.0000 mg | ORAL_TABLET | Freq: Four times a day (QID) | ORAL | Status: DC
  Administered 2019-09-08: 600 mg via ORAL
  Filled 2019-09-08: qty 1

## 2019-09-08 MED ORDER — LIDOCAINE HCL (PF) 1 % IJ SOLN
INTRAMUSCULAR | Status: DC | PRN
  Administered 2019-09-08 (×2): 4 mL via EPIDURAL

## 2019-09-08 NOTE — Plan of Care (Signed)
  Problem: Health Behavior/Discharge Planning: Goal: Ability to manage health-related needs will improve Outcome: Completed/Met   Problem: Clinical Measurements: Goal: Ability to maintain clinical measurements within normal limits will improve Outcome: Completed/Met

## 2019-09-08 NOTE — Progress Notes (Signed)
I offered support to Katrina Phillips and Katrina Phillips (Teta is her preferred name) throughout the day as their baby was born and as they spent time with her. I offered a blessing and listening presence.  I gave them my information for follow up support and will send them additional resources for support.  They plan to speak with their rabbi before making funeral arrangements.  [place], [place] Pager, [phone] 3:53 PM

## 2019-09-08 NOTE — Anesthesia Preprocedure Evaluation (Signed)
Anesthesia Evaluation  Patient identified by MRN, date of birth, ID band Patient awake    Reviewed: Allergy & Precautions, Patient's Chart, lab work & pertinent test results  Airway Mallampati: II  TM Distance: >3 FB Neck ROM: Full    Dental  (+) Teeth Intact   Pulmonary asthma ,    Pulmonary exam normal breath sounds clear to auscultation       Cardiovascular negative cardio ROS Normal cardiovascular exam Rhythm:Regular Rate:Normal     Neuro/Psych PSYCHIATRIC DISORDERS Anxiety Depression negative neurological ROS     GI/Hepatic negative GI ROS, Neg liver ROS,   Endo/Other  negative endocrine ROS  Renal/GU negative Renal ROS     Musculoskeletal negative musculoskeletal ROS (+)   Abdominal   Peds  Hematology negative hematology ROS (+)   Anesthesia Other Findings   Reproductive/Obstetrics (+) Pregnancy IUFD                             Anesthesia Physical Anesthesia Plan  ASA: II  Anesthesia Plan: Epidural   Post-op Pain Management:    Induction:   PONV Risk Score and Plan:   Airway Management Planned:   Additional Equipment:   Intra-op Plan:   Post-operative Plan:   Informed Consent: I have reviewed the patients History and Physical, chart, labs and discussed the procedure including the risks, benefits and alternatives for the proposed anesthesia with the patient or authorized representative who has indicated his/her understanding and acceptance.       Plan Discussed with:   Anesthesia Plan Comments:         Anesthesia Quick Evaluation

## 2019-09-08 NOTE — Discharge Instructions (Signed)
Nothing per vagina x 6 wk, call if soaking a pad every hour or more frequently, temp > 100.4. showers no bath Call office on Monday to schedule follow up appointment with Dr. Pamala Hurry

## 2019-09-08 NOTE — Progress Notes (Signed)
Called by RN for discharge orders for this who has requested to go home after unfortunate loss. Per RN, pt had been counseled regarding discharge earlier and had spoken with Dr Benjie Karvonen. She also has seen chaplain and RN teaching has been done. Pt has hx of depression. Instructed nursing to have pt cont with those medication. Disposition of fetus has not been decided. Questioned RN regarding if pt needed to be seen. She felt pt had been given all the information that was need. I asked her to express my condolences to her and family

## 2019-09-08 NOTE — Anesthesia Postprocedure Evaluation (Signed)
Anesthesia Post Note  Patient: Katrina Phillips  Procedure(s) Performed: AN AD [place]     Patient location during evaluation: Mother Baby Anesthesia Type: Epidural Level of consciousness: awake Pain management: satisfactory to patient Vital Signs Assessment: post-procedure vital signs reviewed and stable Respiratory status: spontaneous breathing Cardiovascular status: stable Anesthetic complications: no    Last Vitals:  Vitals:   09/08/19 1145 09/08/19 1557  BP: 134/79 118/67  Pulse: 71 (!) 56  Resp:  16  Temp:  36.9 C  SpO2:      Last Pain:  Vitals:   09/08/19 1557  TempSrc: Oral  PainSc:    Pain Goal: Patients Stated Pain Goal: 4 (09/08/19 0400) Report via nurse and chart review                Casimer Lanius

## 2019-09-08 NOTE — Anesthesia Procedure Notes (Signed)
Epidural Patient location during procedure: OB Start time: 09/08/2019 4:25 AM End time: 09/08/2019 4:35 AM  Staffing Anesthesiologist: Nolon Nations, MD Performed: anesthesiologist   Preanesthetic Checklist Completed: patient identified, pre-op evaluation, timeout performed, IV checked, risks and benefits discussed and monitors and equipment checked  Epidural Patient position: sitting Prep: site prepped and draped and DuraPrep Patient monitoring: heart rate, continuous pulse ox and blood pressure Approach: midline Location: L3-L4 Injection technique: LOR air and LOR saline  Needle:  Needle type: Tuohy  Needle gauge: 17 G Needle length: 9 cm Needle insertion depth: 6 cm Catheter type: closed end flexible Catheter size: 19 Gauge Catheter at skin depth: 11 cm Test dose: negative  Assessment Sensory level: T8 Events: blood not aspirated, injection not painful, no injection resistance, negative IV test and no paresthesia  Additional Notes Reason for block:procedure for pain

## 2019-09-10 NOTE — Discharge Summary (Signed)
Obstetric Discharge Summary Admission: 09/07/19 Discharge: 09/08/19 Reason for Admission: Labor induction at 37wks with IUFD                                           A1GDM, well controlled                                          Anxiety/Depression, stable on Prozac Prenatal Procedures: Routine PNCare, normal anatomy sono and growth sono in 3rd trim. Well controlled A1GDM. Normal 1st trim screen Intrapartum Procedures: IOL with Cytotec 242mcg every 4 hours, cervical foley placed on 11/26 night and then pitocin started on 11/27, she received epidural, she had spontaneous vaginal delivery of non-viable female with [place] on 09/08/19  Postpartum Procedures: none and Chaplain saw patient and provided comfort for a long time Complications-Operative and Postpartum: 1st degree perineal laceration Hemoglobin  Date Value Ref Range Status  09/07/2019 11.8 (L) 12.0 - 15.0 g/dL Final   HCT  Date Value Ref Range Status  09/07/2019 34.4 (L) 36.0 - 46.0 % Final    Physical Exam:  General: alert and cooperative Lochia: appropriate Uterine Fundus: firm Incision: approximated well DVT Evaluation: No evidence of DVT seen on physical exam.  Discharge Diagnoses: 37 week pregnancy, induced for intrauterine demise,delivered non-viable female infant.  Discharge Information: Date: 09/10/2019 Activity: pelvic rest Diet: routine Medications: Ibuprofen, Tylenol, Prozac, and Alprazolam prn Condition: stable Instructions: refer to practice specific booklet Discharge to: home Follow-up Information    Aloha Gell, MD. Call.   Specialty: Obstetrics and Gynecology Why: monday for f/u appt Contact information: [place] [address] [phone]           Newborn Data: Live born female  Birth Weight: 5 lb 15.4 oz (2705 g) APGAR: 0, 0  Newborn Delivery   Birth date/time: 09/08/2019 09:08:00 Delivery type: Vaginal, Spontaneous   Sent to morgue until burial plan  finalized by family  Elveria Royals 09/10/2019, 8:02 PM

## 2019-09-12 LAB — SURGICAL PATHOLOGY

## 2019-10-13 NOTE — L&D Delivery Note (Signed)
Delivery Note At 2:35 AM a viable female was delivered via Vaginal, Spontaneous (Presentation: Middle Occiput Posterior).  APGAR: 7, 9; weight pending for skin to skin .   Placenta status: Spontaneous, Intact.  Cord: 3 vessels with the following complications: None.  Cord pH: n/a  Anesthesia: None Episiotomy: None Lacerations: 2nd degree Suture Repair: 3.0 vicryl  Est. Blood Loss (mL): 1160 (QBL however large amniotic fluid volume)  Mom to postpartum.  Baby to Couplet care / Skin to Skin.  Katrina Phillips 09/01/2020, 3:18 AM

## 2019-10-18 DIAGNOSIS — B373 Candidiasis of vulva and vagina: Secondary | ICD-10-CM | POA: Diagnosis not present

## 2019-10-18 DIAGNOSIS — N760 Acute vaginitis: Secondary | ICD-10-CM | POA: Diagnosis not present

## 2019-11-03 ENCOUNTER — Ambulatory Visit: Payer: BLUE CROSS/BLUE SHIELD | Attending: Internal Medicine

## 2019-11-03 DIAGNOSIS — Z20822 Contact with and (suspected) exposure to covid-19: Secondary | ICD-10-CM

## 2019-11-04 LAB — NOVEL CORONAVIRUS, NAA: SARS-CoV-2, NAA: NOT DETECTED

## 2019-11-24 ENCOUNTER — Ambulatory Visit: Payer: BLUE CROSS/BLUE SHIELD | Attending: Internal Medicine

## 2019-11-24 DIAGNOSIS — Z20822 Contact with and (suspected) exposure to covid-19: Secondary | ICD-10-CM

## 2019-11-25 LAB — NOVEL CORONAVIRUS, NAA: SARS-CoV-2, NAA: NOT DETECTED

## 2019-12-07 DIAGNOSIS — E7800 Pure hypercholesterolemia, unspecified: Secondary | ICD-10-CM | POA: Diagnosis not present

## 2019-12-07 DIAGNOSIS — Z0000 Encounter for general adult medical examination without abnormal findings: Secondary | ICD-10-CM | POA: Diagnosis not present

## 2019-12-11 DIAGNOSIS — E288 Other ovarian dysfunction: Secondary | ICD-10-CM | POA: Diagnosis not present

## 2019-12-11 DIAGNOSIS — Z319 Encounter for procreative management, unspecified: Secondary | ICD-10-CM | POA: Diagnosis not present

## 2019-12-11 DIAGNOSIS — E282 Polycystic ovarian syndrome: Secondary | ICD-10-CM | POA: Diagnosis not present

## 2019-12-12 DIAGNOSIS — F332 Major depressive disorder, recurrent severe without psychotic features: Secondary | ICD-10-CM | POA: Diagnosis not present

## 2019-12-29 ENCOUNTER — Encounter: Payer: Self-pay | Admitting: Certified Nurse Midwife

## 2020-01-01 ENCOUNTER — Ambulatory Visit: Payer: BLUE CROSS/BLUE SHIELD | Attending: Internal Medicine

## 2020-01-01 DIAGNOSIS — Z20822 Contact with and (suspected) exposure to covid-19: Secondary | ICD-10-CM | POA: Diagnosis not present

## 2020-01-02 LAB — SARS-COV-2, NAA 2 DAY TAT

## 2020-01-02 LAB — NOVEL CORONAVIRUS, NAA: SARS-CoV-2, NAA: NOT DETECTED

## 2020-01-10 DIAGNOSIS — O09291 Supervision of pregnancy with other poor reproductive or obstetric history, first trimester: Secondary | ICD-10-CM | POA: Diagnosis not present

## 2020-01-10 DIAGNOSIS — Z3A01 Less than 8 weeks gestation of pregnancy: Secondary | ICD-10-CM | POA: Diagnosis not present

## 2020-01-10 DIAGNOSIS — Z3200 Encounter for pregnancy test, result unknown: Secondary | ICD-10-CM | POA: Diagnosis not present

## 2020-01-29 DIAGNOSIS — Z3201 Encounter for pregnancy test, result positive: Secondary | ICD-10-CM | POA: Diagnosis not present

## 2020-02-21 DIAGNOSIS — Z3483 Encounter for supervision of other normal pregnancy, third trimester: Secondary | ICD-10-CM | POA: Diagnosis not present

## 2020-02-21 DIAGNOSIS — Z3A10 10 weeks gestation of pregnancy: Secondary | ICD-10-CM | POA: Diagnosis not present

## 2020-02-21 DIAGNOSIS — Z8632 Personal history of gestational diabetes: Secondary | ICD-10-CM | POA: Diagnosis not present

## 2020-02-21 DIAGNOSIS — Z3689 Encounter for other specified antenatal screening: Secondary | ICD-10-CM | POA: Diagnosis not present

## 2020-02-21 DIAGNOSIS — O09299 Supervision of pregnancy with other poor reproductive or obstetric history, unspecified trimester: Secondary | ICD-10-CM | POA: Diagnosis not present

## 2020-02-21 DIAGNOSIS — O09291 Supervision of pregnancy with other poor reproductive or obstetric history, first trimester: Secondary | ICD-10-CM | POA: Diagnosis not present

## 2020-02-21 DIAGNOSIS — O3441 Maternal care for other abnormalities of cervix, first trimester: Secondary | ICD-10-CM | POA: Diagnosis not present

## 2020-02-21 DIAGNOSIS — Z113 Encounter for screening for infections with a predominantly sexual mode of transmission: Secondary | ICD-10-CM | POA: Diagnosis not present

## 2020-02-21 DIAGNOSIS — Z3A12 12 weeks gestation of pregnancy: Secondary | ICD-10-CM | POA: Diagnosis not present

## 2020-02-21 DIAGNOSIS — O09521 Supervision of elderly multigravida, first trimester: Secondary | ICD-10-CM | POA: Diagnosis not present

## 2020-02-21 LAB — OB RESULTS CONSOLE RPR: RPR: NONREACTIVE

## 2020-02-21 LAB — OB RESULTS CONSOLE HEPATITIS B SURFACE ANTIGEN: Hepatitis B Surface Ag: NEGATIVE

## 2020-02-21 LAB — OB RESULTS CONSOLE ANTIBODY SCREEN: Antibody Screen: NEGATIVE

## 2020-02-21 LAB — OB RESULTS CONSOLE GC/CHLAMYDIA
Chlamydia: NEGATIVE
Gonorrhea: NEGATIVE

## 2020-02-21 LAB — OB RESULTS CONSOLE ABO/RH: RH Type: POSITIVE

## 2020-02-21 LAB — OB RESULTS CONSOLE RUBELLA ANTIBODY, IGM: Rubella: IMMUNE

## 2020-02-21 LAB — OB RESULTS CONSOLE HIV ANTIBODY (ROUTINE TESTING): HIV: NONREACTIVE

## 2020-03-06 DIAGNOSIS — O09521 Supervision of elderly multigravida, first trimester: Secondary | ICD-10-CM | POA: Diagnosis not present

## 2020-03-06 DIAGNOSIS — Z3A12 12 weeks gestation of pregnancy: Secondary | ICD-10-CM | POA: Diagnosis not present

## 2020-03-06 DIAGNOSIS — Z3689 Encounter for other specified antenatal screening: Secondary | ICD-10-CM | POA: Diagnosis not present

## 2020-03-28 DIAGNOSIS — Z361 Encounter for antenatal screening for raised alphafetoprotein level: Secondary | ICD-10-CM | POA: Diagnosis not present

## 2020-03-28 DIAGNOSIS — O09522 Supervision of elderly multigravida, second trimester: Secondary | ICD-10-CM | POA: Diagnosis not present

## 2020-03-28 DIAGNOSIS — Z3687 Encounter for antenatal screening for uncertain dates: Secondary | ICD-10-CM | POA: Diagnosis not present

## 2020-03-28 DIAGNOSIS — Z3A16 16 weeks gestation of pregnancy: Secondary | ICD-10-CM | POA: Diagnosis not present

## 2020-04-19 DIAGNOSIS — O09522 Supervision of elderly multigravida, second trimester: Secondary | ICD-10-CM | POA: Diagnosis not present

## 2020-04-19 DIAGNOSIS — Z3A19 19 weeks gestation of pregnancy: Secondary | ICD-10-CM | POA: Diagnosis not present

## 2020-05-17 DIAGNOSIS — Z3A23 23 weeks gestation of pregnancy: Secondary | ICD-10-CM | POA: Diagnosis not present

## 2020-05-17 DIAGNOSIS — O09522 Supervision of elderly multigravida, second trimester: Secondary | ICD-10-CM | POA: Diagnosis not present

## 2020-05-27 DIAGNOSIS — F332 Major depressive disorder, recurrent severe without psychotic features: Secondary | ICD-10-CM | POA: Diagnosis not present

## 2020-06-10 DIAGNOSIS — O24419 Gestational diabetes mellitus in pregnancy, unspecified control: Secondary | ICD-10-CM | POA: Diagnosis not present

## 2020-06-10 DIAGNOSIS — O359XX0 Maternal care for (suspected) fetal abnormality and damage, unspecified, not applicable or unspecified: Secondary | ICD-10-CM | POA: Diagnosis not present

## 2020-06-10 DIAGNOSIS — O09522 Supervision of elderly multigravida, second trimester: Secondary | ICD-10-CM | POA: Diagnosis not present

## 2020-06-19 DIAGNOSIS — O09522 Supervision of elderly multigravida, second trimester: Secondary | ICD-10-CM | POA: Diagnosis not present

## 2020-06-19 DIAGNOSIS — Z3A27 27 weeks gestation of pregnancy: Secondary | ICD-10-CM | POA: Diagnosis not present

## 2020-07-04 DIAGNOSIS — O09523 Supervision of elderly multigravida, third trimester: Secondary | ICD-10-CM | POA: Diagnosis not present

## 2020-07-04 DIAGNOSIS — Z3689 Encounter for other specified antenatal screening: Secondary | ICD-10-CM | POA: Diagnosis not present

## 2020-07-04 DIAGNOSIS — Z3A30 30 weeks gestation of pregnancy: Secondary | ICD-10-CM | POA: Diagnosis not present

## 2020-07-04 DIAGNOSIS — O99613 Diseases of the digestive system complicating pregnancy, third trimester: Secondary | ICD-10-CM | POA: Diagnosis not present

## 2020-07-04 DIAGNOSIS — Z23 Encounter for immunization: Secondary | ICD-10-CM | POA: Diagnosis not present

## 2020-07-24 ENCOUNTER — Other Ambulatory Visit: Payer: Self-pay

## 2020-07-24 ENCOUNTER — Encounter (HOSPITAL_COMMUNITY): Payer: Self-pay | Admitting: Obstetrics and Gynecology

## 2020-07-24 ENCOUNTER — Inpatient Hospital Stay (HOSPITAL_COMMUNITY)
Admission: AD | Admit: 2020-07-24 | Discharge: 2020-07-24 | Disposition: A | Discharge status: Home / Self Care | Payer: No Typology Code available for payment source | Attending: Obstetrics and Gynecology | Admitting: Obstetrics and Gynecology

## 2020-07-24 ENCOUNTER — Inpatient Hospital Stay (HOSPITAL_BASED_OUTPATIENT_CLINIC_OR_DEPARTMENT_OTHER): Payer: No Typology Code available for payment source

## 2020-07-24 DIAGNOSIS — O09523 Supervision of elderly multigravida, third trimester: Secondary | ICD-10-CM | POA: Insufficient documentation

## 2020-07-24 DIAGNOSIS — Z3A32 32 weeks gestation of pregnancy: Secondary | ICD-10-CM | POA: Diagnosis not present

## 2020-07-24 DIAGNOSIS — O4703 False labor before 37 completed weeks of gestation, third trimester: Secondary | ICD-10-CM | POA: Diagnosis present

## 2020-07-24 DIAGNOSIS — O368390 Maternal care for abnormalities of the fetal heart rate or rhythm, unspecified trimester, not applicable or unspecified: Secondary | ICD-10-CM

## 2020-07-24 DIAGNOSIS — Z3689 Encounter for other specified antenatal screening: Secondary | ICD-10-CM | POA: Insufficient documentation

## 2020-07-24 DIAGNOSIS — O09293 Supervision of pregnancy with other poor reproductive or obstetric history, third trimester: Secondary | ICD-10-CM | POA: Insufficient documentation

## 2020-07-24 DIAGNOSIS — O368330 Maternal care for abnormalities of the fetal heart rate or rhythm, third trimester, not applicable or unspecified: Secondary | ICD-10-CM | POA: Diagnosis not present

## 2020-07-24 DIAGNOSIS — Z8759 Personal history of other complications of pregnancy, childbirth and the puerperium: Secondary | ICD-10-CM | POA: Diagnosis not present

## 2020-07-24 DIAGNOSIS — O4700 False labor before 37 completed weeks of gestation, unspecified trimester: Secondary | ICD-10-CM

## 2020-07-24 LAB — URINALYSIS, ROUTINE W REFLEX MICROSCOPIC
Bilirubin Urine: NEGATIVE
Glucose, UA: NEGATIVE mg/dL
Hgb urine dipstick: NEGATIVE
Ketones, ur: 5 mg/dL — AB
Nitrite: NEGATIVE
Protein, ur: NEGATIVE mg/dL
Specific Gravity, Urine: 1.014 (ref 1.005–1.030)
pH: 6 (ref 5.0–8.0)

## 2020-07-24 LAB — FETAL FIBRONECTIN: Fetal Fibronectin: NEGATIVE

## 2020-07-24 MED ORDER — NIFEDIPINE 10 MG PO CAPS
10.0000 mg | ORAL_CAPSULE | ORAL | Status: DC | PRN
  Administered 2020-07-24 (×3): 10 mg via ORAL
  Filled 2020-07-24 (×3): qty 1

## 2020-07-24 NOTE — MAU Provider Note (Addendum)
Chief Complaint:  Non-stress Test (further eval )   First Provider Initiated Contact with Patient 07/24/20 1844      HPI: Katrina Phillips is a 39 y.o. G3P1010 at [redacted]w[redacted]d who presents to maternity admissions sent from the office for variable decelerations on NST and irritability on tocometry.  She is in antenatal testing for hx IUFD at 34 weeks with previous pregnancy.  Pt denies feeling any cramping/contractions and is feeling normal fetal movement.     Other This is a new problem. The current episode started today. Pertinent negatives include no abdominal pain, chest pain, chills, fatigue, fever, headaches, nausea or vomiting.   RN Note: Pt reports she was in the office for a NST. Pt reports that there were some concerning findings on the NST and was told to come in to be further evaluated.  Denies vaginal bleeding, Denies LOF.  Reports +FM   Past Medical History: Past Medical History:  Diagnosis Date  . Abnormal Pap smear of cervix 03/15/2017   HR HPV: +Detected  . ADHD   . Allergy    SEASONAL  . Anxiety   . Asthma    AS A CHILD  . Depression   . H/O concussion 1995    Past obstetric history: OB History  Gravida Para Term Preterm AB Living  3 1 1  0 1 0  SAB TAB Ectopic Multiple Live Births  0 1 0 0 0    # Outcome Date GA Lbr Len/2nd Weight Sex Delivery Anes PTL Lv  3 Current           2 Term 09/08/19 [redacted]w[redacted]d 00:46 / 00:20 2705 g F Vag-Spont EPI  FD  1 TAB 2015            Past Surgical History: Past Surgical History:  Procedure Laterality Date  . COLPOSCOPY  2006  . NO PAST SURGERIES    . WISDOM TOOTH EXTRACTION      Family History: Family History  Problem Relation Age of Onset  . ADD / ADHD Mother   . GER disease Mother   . ADD / ADHD Sister   . Depression Brother   . ADD / ADHD Brother   . Celiac disease Father   . Depression Father   . Depression Maternal Grandmother   . GER disease Maternal Grandfather   . Heart disease Maternal Grandfather   . Celiac  disease Paternal Grandmother   . Kidney disease Paternal Grandmother   . Celiac disease Paternal Uncle   . Melanoma Paternal Aunt     Social History: Social History   Tobacco Use  . Smoking status: Never Smoker  . Smokeless tobacco: Never Used  Vaping Use  . Vaping Use: Never used  Substance Use Topics  . Alcohol use: Not Currently    Alcohol/week: 1.0 - 4.0 standard drink    Types: 1 - 4 Standard drinks or equivalent per week    Comment: social  . Drug use: No    Allergies: No Known Allergies  Meds:  Medications Prior to Admission  Medication Sig Dispense Refill Last Dose  . acetaminophen (TYLENOL) 500 MG tablet Take 500 mg by mouth every 6 (six) hours as needed for headache.   Past Month at Unknown time  . aspirin EC 81 MG tablet Take 81 mg by mouth daily. Swallow whole.   07/24/2020 at Unknown time  . ferrous sulfate 325 (65 FE) MG tablet Take 325 mg by mouth daily with breakfast.   07/24/2020 at Unknown time  .  metFORMIN (GLUCOPHAGE) 1000 MG tablet Take 1,000 mg by mouth 2 (two) times daily with a meal.   07/24/2020 at Unknown time  . Prenatal Vit-Fe Fumarate-FA (PRENATAL PO) Take 1 tablet by mouth daily.    07/24/2020 at Unknown time  . buPROPion (WELLBUTRIN XL) 300 MG 24 hr tablet Take 1 tablet (300 mg total) by mouth daily. (Patient not taking: Reported on 09/07/2019) 30 tablet 1   . calcium carbonate (TUMS - DOSED IN MG ELEMENTAL CALCIUM) 500 MG chewable tablet Chew 1-2 tablets by mouth as needed for indigestion or heartburn.     Marland Kitchen FLUoxetine (PROZAC) 20 MG tablet Take 1 tablet (20 mg total) by mouth daily. (Patient taking differently: Take 40 mg by mouth daily. ) 30 tablet 1   . ibuprofen (ADVIL) 800 MG tablet Take 1 tablet (800 mg total) by mouth every 8 (eight) hours as needed. 30 tablet 6     ROS:  Review of Systems  Constitutional: Negative for chills, fatigue and fever.  Eyes: Negative for visual disturbance.  Respiratory: Negative for shortness of breath.    Cardiovascular: Negative for chest pain.  Gastrointestinal: Negative for abdominal pain, nausea and vomiting.  Genitourinary: Negative for difficulty urinating, dysuria, flank pain, pelvic pain, vaginal bleeding, vaginal discharge and vaginal pain.  Neurological: Negative for dizziness and headaches.  Psychiatric/Behavioral: Negative.      I have reviewed patient's Past Medical Hx, Surgical Hx, Family Hx, Social Hx, medications and allergies.   Physical Exam   Patient Vitals for the past 24 hrs:  BP Temp Temp src Pulse Resp SpO2 Weight  07/24/20 2039 110/70 -- -- -- -- -- --  07/24/20 2016 107/68 -- -- -- -- -- --  07/24/20 1822 -- -- -- -- -- -- 89 kg  07/24/20 1820 116/70 -- -- 88 -- 94 % --  07/24/20 1818 -- 98.7 F (37.1 C) Oral -- 12 94 % --  07/24/20 1756 -- -- -- -- -- -- 89 kg   Constitutional: Well-developed, well-nourished female in no acute distress.  Cardiovascular: normal rate Respiratory: normal effort GI: Abd soft, non-tender, gravid appropriate for gestational age.  MS: Extremities nontender, no edema, normal ROM Neurologic: Alert and oriented x 4.  GU: Neg CVAT.  Dilation: 1.5 Effacement (%): 60 Cervical Position: Posterior Presentation: Vertex Exam by:: Sharen Counter, CNM  FHT:  Baseline 135 , moderate variability, accelerations present, no decelerations Contractions: q 2-3 minutes, mild to palpation   Labs: Results for orders placed or performed during the hospital encounter of 07/24/20 (from the past 24 hour(s))  Urinalysis, Routine w reflex microscopic Urine, Clean Catch     Status: Abnormal   Collection Time: 07/24/20  7:02 PM  Result Value Ref Range   Color, Urine YELLOW YELLOW   APPearance HAZY (A) CLEAR   Specific Gravity, Urine 1.014 1.005 - 1.030   pH 6.0 5.0 - 8.0   Glucose, UA NEGATIVE NEGATIVE mg/dL   Hgb urine dipstick NEGATIVE NEGATIVE   Bilirubin Urine NEGATIVE NEGATIVE   Ketones, ur 5 (A) NEGATIVE mg/dL   Protein, ur  NEGATIVE NEGATIVE mg/dL   Nitrite NEGATIVE NEGATIVE   Leukocytes,Ua TRACE (A) NEGATIVE   RBC / HPF 0-5 0 - 5 RBC/hpf   WBC, UA 0-5 0 - 5 WBC/hpf   Bacteria, UA RARE (A) NONE SEEN   Squamous Epithelial / LPF 0-5 0 - 5   Mucus PRESENT   Fetal fibronectin     Status: None   Collection Time: 07/24/20  7:16  PM  Result Value Ref Range   Fetal Fibronectin NEGATIVE NEGATIVE   --/--/A POS, A POS Performed at [facility], [address] [place], [place] [address]  (11/26 0900)  Imaging:  No results found.  MAU Course/MDM: Orders Placed This Encounter  Procedures  . [place] MFM OB Limited  . Urinalysis, Routine w reflex microscopic Urine, Clean Catch  . Fetal fibronectin    Meds ordered this encounter  Medications  . NIFEdipine (PROCARDIA) capsule 10 mg     NST reviewed and reactive Limited OB US  Report to Wynelle Bourgeois, CNM   Sharen Counter Certified Nurse-Midwife 07/24/2020 9:06 PM   We opted to give her the Procardia series for the contractions she was having She received 3 doses and contractions diminished, never really felt much by the patient Fetal fibronectin which was sent from the office resulted as NEGATIVE Urinalysis was essentially negative for signs of infection Recheck of cervix was unchanged.  I thought her cervix was less dilated this time Discussed with Dr Shawnie Pons.  In light of neg FFn, painless contractions and no cervical change, will discharge home  Encouraged to return here or to other Urgent Care/ED if she develops worsening of symptoms, increase in pain, fever, or other concerning symptoms.  Preterm labor precautions    Aviva Signs, CNM

## 2020-07-24 NOTE — MAU Note (Signed)
Pt reports she was in the office for a NST. Pt reports that there were some concerning findings on the NST and was told to come in to be further evaluated.   Denies vaginal bleeding, Denies LOF.   Reports +FM

## 2020-07-24 NOTE — Discharge Instructions (Signed)
Polyhydramnios (your baby has mild polyhydramnios) When a woman becomes pregnant, a sac forms around her growing baby. This sac, called the amniotic sac, is filled with fluid (amniotic fluid) that:  Cushions and protects the baby.  Helps the baby's lungs and gastrointestinal tract grow.  Allows the baby to move around, which helps the baby's muscles and bones develop. Polyhydramnios means that there is too much fluid in the sac. Babies born with polyhydramnios should be checked for birth defects. What are the causes?   (usually we cannot identify a cause) This condition may be caused by:  Diabetes in the mother.  The baby being larger than normal for the baby's age (large for gestational age).  Problems that prevent the fetus from swallowing amniotic fluid. These may include: ? An abnormal chromosome. ? Abnormality in the baby's intestinal tract. ? A birth defect in which the baby does not have a brain or parts of the brain (anencephaly).  A condition that affects twins, called twin-twin transfusion syndrome.  An illness in the mother, such as kidney or heart disease.  A tumor of the placenta (chorioangioma).  An infection in the baby. What are the signs or symptoms? Symptoms of this condition include:  A uterus that is larger than it should be for the stage of pregnancy.  Shortness of breath.  Increased feeling of pressure on the abdomen.  Increased swelling in the legs.  Preterm labor.  Preeclampsia. How is this diagnosed? This condition may be diagnosed based on:  A measurement of your abdomen. Your health care provider can diagnose the condition if he or she measures you and notices that your uterus is larger than it should be.  An ultrasound. This image may be taken by placing a device on your abdomen or into your vagina. The test can measure the amount of fluid in the amniotic sac (amniotic fluid index). It can also: ? Show if you are carrying twins or  more. ? Measure the growth of the baby. ? Look for birth defects. How is this treated? This condition is treated by removing fluid from the amniotic sac. This is done through a procedure where a needle is inserted through the skin into the uterus (amniocentesis). Follow these instructions at home:  Make sure to keep any medical conditions you have under control. If you have diabetes, talk to your health care provider about ways to manage your blood sugar.  Keep all your prenatal visits as told by your health care provider. It is important to follow your health care provider's recommendations. Contact a health care provider if:  You think your uterus has grown too fast in a short period of time.  You feel a great amount of pressure in your pelvis and are more uncomfortable than expected. Get help right away if:  You have a gush of fluid or are leaking fluid from your vagina.  You stop feeling the baby move.  You do not feel the baby kicking as much as usual.  You have diabetes and you have a hard time keeping it under control.  You have kidney or heart disease and it is causing you problems. Summary  Polyhydramnios means there is too much fluid in the amniotic sac. This can lead to birth defects.  This condition may be diagnosed based on a measurement of your abdomen or an ultrasound.  Keep all your prenatal visits as told by your health care provider. It is important to follow your health care provider's recommendations. This  information is not intended to replace advice given to you by your health care provider. Make sure you discuss any questions you have with your health care provider. Document Revised: 09/10/2017 Document Reviewed: 12/15/2016 Elsevier Patient Education  2020 [employer]. Preterm Labor and Birth Information Pregnancy normally lasts 39-41 weeks. Preterm labor is when labor starts early. It starts before you have been pregnant for 37 whole weeks. What are the  risk factors for preterm labor? Preterm labor is more likely to occur in women who:  Have an infection while pregnant.  Have a cervix that is short.  Have gone into preterm labor before.  Have had surgery on their cervix.  Are younger than age 49.  Are older than age 78.  Are African American.  Are pregnant with two or more babies.  Take street drugs while pregnant.  Smoke while pregnant.  Do not gain enough weight while pregnant.  Got pregnant right after another pregnancy. What are the symptoms of preterm labor? Symptoms of preterm labor include:  Cramps. The cramps may feel like the cramps some women get during their period. The cramps may happen with watery poop (diarrhea).  Pain in the belly (abdomen).  Pain in the lower back.  Regular contractions or tightening. It may feel like your belly is getting tighter.  Pressure in the lower belly that seems to get stronger.  More fluid (discharge) leaking from the vagina. The fluid may be watery or bloody.  Water breaking. Why is it important to notice signs of preterm labor? Babies who are born early may not be fully developed. They have a higher chance for:  Long-term heart problems.  Long-term lung problems.  Trouble controlling body systems, like breathing.  Bleeding in the brain.  A condition called cerebral palsy.  Learning difficulties.  Death. These risks are highest for babies who are born before 34 weeks of pregnancy. How is preterm labor treated? Treatment depends on:  How long you were pregnant.  Your condition.  The health of your baby. Treatment may involve:  Having a stitch (suture) placed in your cervix. When you give birth, your cervix opens so the baby can come out. The stitch keeps the cervix from opening too soon.  Staying at the hospital.  Taking or getting medicines, such as: ? Hormone medicines. ? Medicines to stop contractions. ? Medicines to help the baby's lungs  develop. ? Medicines to prevent your baby from having cerebral palsy. What should I do if I am in preterm labor? If you think you are going into labor too soon, call your doctor right away. How can I prevent preterm labor?  Do not use any tobacco products. ? Examples of these are cigarettes, chewing tobacco, and e-cigarettes. ? If you need help quitting, ask your doctor.  Do not use street drugs.  Do not use any medicines unless you ask your doctor if they are safe for you.  Talk with your doctor before taking any herbal supplements.  Make sure you gain enough weight.  Watch for infection. If you think you might have an infection, get it checked right away.  If you have gone into preterm labor before, tell your doctor. This information is not intended to replace advice given to you by your health care provider. Make sure you discuss any questions you have with your health care provider. Document Revised: 01/20/2019 Document Reviewed: 02/19/2016 Elsevier Patient Education  2020 [employer].

## 2020-08-13 LAB — OB RESULTS CONSOLE GBS: GBS: POSITIVE

## 2020-08-22 ENCOUNTER — Other Ambulatory Visit (HOSPITAL_COMMUNITY): Payer: Self-pay | Admitting: Advanced Practice Midwife

## 2020-08-27 ENCOUNTER — Telehealth (HOSPITAL_COMMUNITY): Payer: Self-pay | Admitting: *Deleted

## 2020-08-27 NOTE — Telephone Encounter (Signed)
Preadmission screen  

## 2020-08-28 ENCOUNTER — Telehealth (HOSPITAL_COMMUNITY): Payer: Self-pay | Admitting: *Deleted

## 2020-08-28 ENCOUNTER — Encounter (HOSPITAL_COMMUNITY): Payer: Self-pay | Admitting: *Deleted

## 2020-08-28 NOTE — Telephone Encounter (Signed)
Preadmission screen  

## 2020-08-31 ENCOUNTER — Inpatient Hospital Stay (HOSPITAL_COMMUNITY)
Admission: AD | Admit: 2020-08-31 | Discharge: 2020-09-03 | DRG: 806 | Disposition: A | Discharge status: Home / Self Care | Payer: 59 | Attending: Obstetrics and Gynecology | Admitting: Obstetrics and Gynecology

## 2020-08-31 DIAGNOSIS — O99344 Other mental disorders complicating childbirth: Secondary | ICD-10-CM | POA: Diagnosis present

## 2020-08-31 DIAGNOSIS — B951 Streptococcus, group B, as the cause of diseases classified elsewhere: Secondary | ICD-10-CM | POA: Diagnosis present

## 2020-08-31 DIAGNOSIS — F419 Anxiety disorder, unspecified: Secondary | ICD-10-CM | POA: Diagnosis present

## 2020-08-31 DIAGNOSIS — O24419 Gestational diabetes mellitus in pregnancy, unspecified control: Secondary | ICD-10-CM

## 2020-08-31 DIAGNOSIS — F909 Attention-deficit hyperactivity disorder, unspecified type: Secondary | ICD-10-CM | POA: Diagnosis present

## 2020-08-31 DIAGNOSIS — O24425 Gestational diabetes mellitus in childbirth, controlled by oral hypoglycemic drugs: Principal | ICD-10-CM | POA: Diagnosis present

## 2020-08-31 DIAGNOSIS — O99824 Streptococcus B carrier state complicating childbirth: Secondary | ICD-10-CM | POA: Diagnosis present

## 2020-08-31 DIAGNOSIS — F418 Other specified anxiety disorders: Secondary | ICD-10-CM | POA: Diagnosis present

## 2020-08-31 DIAGNOSIS — Z3A38 38 weeks gestation of pregnancy: Secondary | ICD-10-CM

## 2020-08-31 DIAGNOSIS — Z20822 Contact with and (suspected) exposure to covid-19: Secondary | ICD-10-CM | POA: Diagnosis present

## 2020-09-01 ENCOUNTER — Encounter (HOSPITAL_COMMUNITY): Payer: Self-pay | Admitting: Obstetrics and Gynecology

## 2020-09-01 ENCOUNTER — Other Ambulatory Visit: Payer: Self-pay

## 2020-09-01 DIAGNOSIS — O99344 Other mental disorders complicating childbirth: Secondary | ICD-10-CM | POA: Diagnosis present

## 2020-09-01 DIAGNOSIS — Z20822 Contact with and (suspected) exposure to covid-19: Secondary | ICD-10-CM | POA: Diagnosis present

## 2020-09-01 DIAGNOSIS — O26893 Other specified pregnancy related conditions, third trimester: Secondary | ICD-10-CM | POA: Diagnosis present

## 2020-09-01 DIAGNOSIS — F909 Attention-deficit hyperactivity disorder, unspecified type: Secondary | ICD-10-CM | POA: Diagnosis present

## 2020-09-01 DIAGNOSIS — O24425 Gestational diabetes mellitus in childbirth, controlled by oral hypoglycemic drugs: Secondary | ICD-10-CM | POA: Diagnosis present

## 2020-09-01 DIAGNOSIS — Z3A38 38 weeks gestation of pregnancy: Secondary | ICD-10-CM | POA: Diagnosis not present

## 2020-09-01 DIAGNOSIS — F419 Anxiety disorder, unspecified: Secondary | ICD-10-CM | POA: Diagnosis present

## 2020-09-01 DIAGNOSIS — O99824 Streptococcus B carrier state complicating childbirth: Secondary | ICD-10-CM | POA: Diagnosis present

## 2020-09-01 LAB — CBC
HCT: 35 % — ABNORMAL LOW (ref 36.0–46.0)
HCT: 34.3 % — ABNORMAL LOW (ref 36.0–46.0)
Hemoglobin: 11.2 g/dL — ABNORMAL LOW (ref 12.0–15.0)
Hemoglobin: 11.1 g/dL — ABNORMAL LOW (ref 12.0–15.0)
MCH: 30.5 pg (ref 26.0–34.0)
MCH: 30.5 pg (ref 26.0–34.0)
MCHC: 32 g/dL (ref 30.0–36.0)
MCHC: 32.4 g/dL (ref 30.0–36.0)
MCV: 95.4 fL (ref 80.0–100.0)
MCV: 94.2 fL (ref 80.0–100.0)
Platelets: 261 10*3/uL (ref 150–400)
Platelets: 267 10*3/uL (ref 150–400)
RBC: 3.67 MIL/uL — ABNORMAL LOW (ref 3.87–5.11)
RBC: 3.64 MIL/uL — ABNORMAL LOW (ref 3.87–5.11)
RDW: 13.4 % (ref 11.5–15.5)
RDW: 13.2 % (ref 11.5–15.5)
WBC: 12.3 10*3/uL — ABNORMAL HIGH (ref 4.0–10.5)
WBC: 20.4 10*3/uL — ABNORMAL HIGH (ref 4.0–10.5)
nRBC: 0 % (ref 0.0–0.2)
nRBC: 0 % (ref 0.0–0.2)

## 2020-09-01 LAB — TYPE AND SCREEN
ABO/RH(D): A POS
Antibody Screen: NEGATIVE

## 2020-09-01 LAB — RESPIRATORY PANEL BY RT PCR (FLU A&B, COVID)
Influenza A by PCR: NEGATIVE
Influenza B by PCR: NEGATIVE
SARS Coronavirus 2 by RT PCR: NEGATIVE

## 2020-09-01 LAB — RPR: RPR Ser Ql: NONREACTIVE

## 2020-09-01 MED ORDER — PRENATAL MULTIVITAMIN CH
1.0000 | ORAL_TABLET | Freq: Every day | ORAL | Status: DC
  Administered 2020-09-01 – 2020-09-03 (×3): 1 via ORAL
  Filled 2020-09-01 (×3): qty 1

## 2020-09-01 MED ORDER — OXYTOCIN BOLUS FROM INFUSION
333.0000 mL | Freq: Once | INTRAVENOUS | Status: AC
  Administered 2020-09-01: 333 mL via INTRAVENOUS

## 2020-09-01 MED ORDER — SODIUM CHLORIDE 0.9 % IV SOLN
2.0000 g | Freq: Once | INTRAVENOUS | Status: AC
  Administered 2020-09-01: 2 g via INTRAVENOUS

## 2020-09-01 MED ORDER — OXYCODONE-ACETAMINOPHEN 5-325 MG PO TABS: 1.0000 | ORAL_TABLET | ORAL | Status: DC | PRN

## 2020-09-01 MED ORDER — ONDANSETRON HCL 4 MG PO TABS: 4.0000 mg | ORAL_TABLET | ORAL | Status: DC | PRN

## 2020-09-01 MED ORDER — ACETAMINOPHEN 325 MG PO TABS: 650.0000 mg | ORAL_TABLET | ORAL | Status: DC | PRN

## 2020-09-01 MED ORDER — TETANUS-DIPHTH-ACELL PERTUSSIS 5-2.5-18.5 LF-MCG/0.5 IM SUSY: 0.5000 mL | PREFILLED_SYRINGE | Freq: Once | INTRAMUSCULAR | Status: DC

## 2020-09-01 MED ORDER — LACTATED RINGERS IV SOLN: INTRAVENOUS | Status: DC

## 2020-09-01 MED ORDER — LACTATED RINGERS IV SOLN: 500.0000 mL | INTRAVENOUS | Status: DC | PRN

## 2020-09-01 MED ORDER — SENNOSIDES-DOCUSATE SODIUM 8.6-50 MG PO TABS
2.0000 | ORAL_TABLET | ORAL | Status: DC
  Administered 2020-09-02: 2 via ORAL
  Filled 2020-09-01: qty 2

## 2020-09-01 MED ORDER — BENZOCAINE-MENTHOL 20-0.5 % EX AERO
1.0000 "application " | INHALATION_SPRAY | CUTANEOUS | Status: DC | PRN
  Administered 2020-09-01: 1 via TOPICAL
  Filled 2020-09-01: qty 56

## 2020-09-01 MED ORDER — SIMETHICONE 80 MG PO CHEW
80.0000 mg | CHEWABLE_TABLET | ORAL | Status: DC | PRN
  Administered 2020-09-01: 80 mg via ORAL
  Filled 2020-09-01: qty 1

## 2020-09-01 MED ORDER — SODIUM CHLORIDE 0.9 % IV SOLN
INTRAVENOUS | Status: AC
  Filled 2020-09-01: qty 2000

## 2020-09-01 MED ORDER — DIBUCAINE (PERIANAL) 1 % EX OINT: 1.0000 "application " | TOPICAL_OINTMENT | CUTANEOUS | Status: DC | PRN

## 2020-09-01 MED ORDER — ACETAMINOPHEN 325 MG PO TABS
650.0000 mg | ORAL_TABLET | ORAL | Status: DC | PRN
  Administered 2020-09-01: 650 mg via ORAL
  Filled 2020-09-01: qty 2

## 2020-09-01 MED ORDER — FLEET ENEMA 7-19 GM/118ML RE ENEM: 1.0000 | ENEMA | Freq: Every day | RECTAL | Status: DC | PRN

## 2020-09-01 MED ORDER — ONDANSETRON HCL 4 MG/2ML IJ SOLN: 4.0000 mg | Freq: Four times a day (QID) | INTRAMUSCULAR | Status: DC | PRN

## 2020-09-01 MED ORDER — SODIUM CHLORIDE 0.9 % IV SOLN: 5.0000 10*6.[IU] | Freq: Once | INTRAVENOUS | Status: DC

## 2020-09-01 MED ORDER — IBUPROFEN 600 MG PO TABS
600.0000 mg | ORAL_TABLET | Freq: Four times a day (QID) | ORAL | Status: DC
  Administered 2020-09-01 – 2020-09-03 (×8): 600 mg via ORAL
  Filled 2020-09-01 (×8): qty 1

## 2020-09-01 MED ORDER — DIPHENHYDRAMINE HCL 25 MG PO CAPS: 25.0000 mg | ORAL_CAPSULE | Freq: Four times a day (QID) | ORAL | Status: DC | PRN

## 2020-09-01 MED ORDER — METHYLERGONOVINE MALEATE 0.2 MG/ML IJ SOLN: 0.2000 mg | Freq: Once | INTRAMUSCULAR | Status: DC

## 2020-09-01 MED ORDER — FLUOXETINE HCL 20 MG PO CAPS
40.0000 mg | ORAL_CAPSULE | Freq: Every day | ORAL | Status: DC
  Administered 2020-09-01 – 2020-09-03 (×3): 40 mg via ORAL
  Filled 2020-09-01 (×4): qty 2

## 2020-09-01 MED ORDER — PENICILLIN G POT IN DEXTROSE 60000 UNIT/ML IV SOLN: 3.0000 10*6.[IU] | INTRAVENOUS | Status: DC

## 2020-09-01 MED ORDER — LIDOCAINE HCL (PF) 1 % IJ SOLN
30.0000 mL | INTRAMUSCULAR | Status: AC | PRN
  Administered 2020-09-01: 30 mL via SUBCUTANEOUS
  Filled 2020-09-01: qty 30

## 2020-09-01 MED ORDER — METHYLERGONOVINE MALEATE 0.2 MG/ML IJ SOLN
INTRAMUSCULAR | Status: AC
  Administered 2020-09-01: 0.2 mg
  Filled 2020-09-01: qty 1

## 2020-09-01 MED ORDER — WITCH HAZEL-GLYCERIN EX PADS: 1.0000 "application " | MEDICATED_PAD | CUTANEOUS | Status: DC | PRN

## 2020-09-01 MED ORDER — SOD CITRATE-CITRIC ACID 500-334 MG/5ML PO SOLN
30.0000 mL | ORAL | Status: DC | PRN
  Filled 2020-09-01: qty 15

## 2020-09-01 MED ORDER — OXYCODONE-ACETAMINOPHEN 5-325 MG PO TABS: 2.0000 | ORAL_TABLET | ORAL | Status: DC | PRN

## 2020-09-01 MED ORDER — ONDANSETRON HCL 4 MG/2ML IJ SOLN: 4.0000 mg | INTRAMUSCULAR | Status: DC | PRN

## 2020-09-01 MED ORDER — COCONUT OIL OIL: 1.0000 "application " | TOPICAL_OIL | Status: DC | PRN

## 2020-09-01 MED ORDER — OXYTOCIN-SODIUM CHLORIDE 30-0.9 UT/500ML-% IV SOLN
2.5000 [IU]/h | INTRAVENOUS | Status: DC
  Administered 2020-09-01: 2.5 [IU]/h via INTRAVENOUS
  Filled 2020-09-01: qty 500

## 2020-09-01 MED ORDER — ZOLPIDEM TARTRATE 5 MG PO TABS: 5.0000 mg | ORAL_TABLET | Freq: Every evening | ORAL | Status: DC | PRN

## 2020-09-01 NOTE — Lactation Note (Signed)
This note was copied from a baby's chart. Lactation Consultation Note  Patient Name: Katrina Phillips ZOXWR'U Date: 09/01/2020 Reason for consult: Follow-up assessment;Early term 37-38.6wks;1st time breastfeeding  P2 mother whose infant is now 2 hours old.  This is an ETI at 38+3 weeks.  Mother's first child was a stillborn.    Baby was admitted to the NICU at 10 hours of life due to desaturations in nursery.  Once in the NICU baby had no further respiratory distress or desaturations.    RN called for latch assistance.  Baby was awake and alert when I arrived.  He was showing feeding cues.  Offered to assist with latching and mother agreeable.  Taught hand expression and mother was able to easily express many colostrum drops which I finger fed to baby.  He eagerly sucked the drops with a good strong suck.  Assisted to latch to the left breast in the football hold.  With some guidance he began to suck.  After a few sucks he pulled back.  With repeated attempts to was able to latch and sustain a good rhythmic sucking motion.  Mother felt a good tug at the breast and denied pain.  Jaw movements were deep and rhythmic.  Demonstrated breast compressions and gentle stimulation to encourage him to continue.  No apnea, bradycardia or desaturations noted with feeding.  After approximately 15 minutes he got the hiccoughs and I allowed him to suck on my gloved finger with drops of colostrum until the hiccoughs dissipated.  Placed him STS on mother's chest and he began to show more feeding cues.  Placed him back at the breast and he fed for an additional 15 minutes without distress.  Praised mother for her efforts.  Extensive teaching completed while watching him breast feed.  Parents very excited to see him feeding.  RN in room at the end of feeding to assess baby and swaddle him for father to hold.  I continued working with mother regarding pumping.  Pump parts, assembly, disassembly and cleaning reviewed.   Observed mother using a #24 flange currently but made her aware that she will probably increase to a #27 in the near future.  She is aware of how to observe flange size during pumping.  Mother had no further questions/concerns at this time.  She will call for assistance as needed.  Mother has a DEBP for home use.  RN updated.   Maternal Data Formula Feeding for Exclusion: No Has patient been taught Hand Expression?: Yes Does the patient have breastfeeding experience prior to this delivery?: No  Feeding Feeding Type: Breast Fed  LATCH Score Latch: Grasps breast easily, tongue down, lips flanged, rhythmical sucking.  Audible Swallowing: A few with stimulation  Type of Nipple: Everted at rest and after stimulation  Comfort (Breast/Nipple): Soft / non-tender  Hold (Positioning): Assistance needed to correctly position infant at breast and maintain latch.  LATCH Score: 8  Interventions Interventions: Breast feeding basics reviewed;Assisted with latch;Skin to skin;Breast massage;Hand express;Breast compression;Adjust position;DEBP;Hand pump;Position options;Support pillows  Lactation Tools Discussed/Used Pump Review: Setup, frequency, and cleaning;Milk Storage Initiated by:: Brack Shaddock Date initiated:: 09/01/20   Consult Status Consult Status: Follow-up Date: 09/02/20 Follow-up type: In-patient    Katrina Phillips R Charlett Merkle 09/01/2020, 6:06 PM

## 2020-09-01 NOTE — MAU Note (Signed)
.   Katrina Phillips is a 39 y.o. at [redacted]w[redacted]d here in MAU reporting: ctx that began this afternoon. No VB or LOF. Endorses good fetal movement. GBS +.   Pain score: 7 Vitals:   09/01/20 0012  BP: 130/81  Pulse: 76  Resp: 14  Temp: 98 F (36.7 C)     FHT:156

## 2020-09-01 NOTE — Lactation Note (Signed)
This note was copied from a baby's chart. Lactation Consultation Note  Patient Name: Katrina Phillips VUDTH'Y Date: 09/01/2020 Reason for consult: Initial assessment;NICU baby;1st time breastfeeding LC to room for initial consult with family of term infant admitted to NICU. Mother bf p delivery and hand expressed for baby earlier today. This LC set up electric pump and reviewed use/cleaning/safe milk storage. Infant's feeding plan unknown at this time. LC to return prn to assist mother with pumping. Patient was provided with the opportunity to ask questions. All concerns were addressed.  Will plan follow up visit.   Maternal Data Has patient been taught Hand Expression?: Yes +breast changes with pregnancy Meds: Prozac, Metformin, 81mg  Asprin,   Interventions Interventions: Breast feeding basics reviewed;Expressed milk;Hand express;Hand pump;DEBP  Lactation Tools Discussed/Used Pump Review: Setup, frequency, and cleaning;Milk Storage Initiated by:: LMiller Date initiated:: 09/01/20   Consult Status Consult Status: Follow-up Date: 09/02/20 Follow-up type: In-patient    09/04/20 09/01/2020, 3:00 PM

## 2020-09-01 NOTE — H&P (Signed)
Katrina Phillips is a 39 y.o. female G3P1010 [redacted]w[redacted]d presenting for labor. Patient reports onset of contractions around 6PM initially irregular but increased in intensity and frequency around 10PM. No VB or LOF. Good FM.   Routine PNC at [facility]. 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 History significant for 5w MAB followed by a 36w IUFD with vaginal delivery. History of PCOS and early diagnosis of GDM in last pregnancy and had been on Metformin 1,000mg  BID coming into the pregnancy. A first trimester A1C was 5.5 and 1hr GTT was 144, decision was made to continue to manage as GDMA2 and Metformin continued. She had normal fetal anatomy as well as normal fetal echo scans and followed with serial growth. Most recent growth performed on  11/17 at 37.6 with EFW of 7#4 = 56% with vertex presentation and posterior placenta noted. LR Panorama and neg msAFP screen performed. History of LEEP 2018. Had possible shortened CL in last pregnancy treated with vaginal progesterone, normal CL screening in this pregnancy, no prog or cerclage treatments required. Medical history for anxiety well managed with therapy and Prozac 40 mg daily. Additional daily medications include PNV, Ferraplus, Pepcid, and ASA.   OB History    Gravida  3   Para  1   Term  1   Preterm  0   AB  1   Living  0     SAB  0   TAB  1   Ectopic  0   Multiple  0   Live Births  0          Past Medical History:  Diagnosis Date  . Abnormal Pap smear of cervix 03/15/2017   HR HPV: +Detected  . ADHD   . Allergy    SEASONAL  . Anxiety   . Asthma    AS A CHILD  . Depression   . Gestational diabetes   . H/O concussion 1995   Past Surgical History:  Procedure Laterality Date  . COLPOSCOPY  2006  . LEEP    . NO PAST SURGERIES    . WISDOM TOOTH EXTRACTION     Family History: family history includes ADD / ADHD in her brother, mother, and sister; Celiac disease in her father, paternal  grandmother, and paternal uncle; Depression in her brother, father, and maternal grandmother; GER disease in her maternal grandfather and mother; Heart disease in her maternal grandfather; Kidney disease in her paternal grandmother; Melanoma in her paternal aunt. Social History:  reports that she has never smoked. She has never used smokeless tobacco. She reports previous alcohol use of about 1.0 - 4.0 standard drink of alcohol per week. She reports that she does not use drugs.     Maternal Diabetes: Yes:  Diabetes Type:  Insulin/Medication controlled Genetic Screening: Normal Maternal Ultrasounds/Referrals: Normal Fetal Ultrasounds or other Referrals:  Fetal echo Maternal Substance Abuse:  No Significant Maternal Medications:  Meds include: Prozac Significant Maternal Lab Results:  Group B Strep positive Other Comments:  None  Review of Systems  All other systems reviewed and are negative.  Per HPI Maternal Exam:  Uterine Assessment: Contraction strength is firm.  Contraction frequency is regular.   Abdomen: Patient reports no abdominal tenderness. Fetal presentation: vertex  Introitus: Normal vagina.  Pelvis: adequate for delivery.   Cervix: Cervix evaluated by digital exam.     Fetal Exam Fetal Monitor Review: Baseline rate: 135.  Variability: moderate (6-25 bpm).   Pattern: accelerations present  and no decelerations.    Fetal State Assessment: Category I - tracings are normal.     Physical Exam  Dilation: 5 Effacement (%): 90 Station: -2 Exam by:: [employer]  Blood pressure 130/81, pulse 76, temperature 98 F (36.7 C), temperature source Oral, resp. rate 14, height 5\' 9"  (1.753 m), weight 90.6 kg.   Cervical exam performed by myself upon evaluation: 10/100/+1 ruptured clear fluid  Prenatal labs: ABO, Rh:  A/Positive/-- (05/12 0000) Antibody: Negative (05/12 0000) Rubella: Immune (05/12 0000) RPR: Nonreactive (05/12 0000)  HBsAg: Negative (05/12 0000)   HIV: Non-reactive (05/12 0000)  GBS: Positive/-- (11/02 0000)   Assessment/Plan: 39Y G3P0110 @ 38.3 in labor GBS+/RH+/RI  -Admit to LD and initiate labor mgmt -Routine admission labs and COVID screen -GBS+ will start Ampicillin 2g now given imminent delivery -cont EFM/Toco- cat I -GDMA2 on Metformin 1000 BID -Clear diabetic diet -Routine intrapartum care -Anticipate vaginal delivery  Katrina Phillips A Asberry Lascola 09/01/2020, 1:03 AM

## 2020-09-02 DIAGNOSIS — B951 Streptococcus, group B, as the cause of diseases classified elsewhere: Secondary | ICD-10-CM | POA: Diagnosis present

## 2020-09-02 DIAGNOSIS — O24419 Gestational diabetes mellitus in pregnancy, unspecified control: Secondary | ICD-10-CM

## 2020-09-02 NOTE — Progress Notes (Signed)
CSW acknowledged consulted and completed chart review. CSW attempted to meet with MOB and FOB in room 328.  When CSW arrived, the parent's was asleep but was easily awaken.  MOB requested that CSW return at a later time. CSW agreed to meet with MOB and FOB tomorrow (11/23) to complete clinical assessment.   CSW will continue to offer resources and supports to family while infant remains in NICU.   Jaiona Simien Boyd-Gilyard, MSW, LCSW Clinical Social Work [phone] 

## 2020-09-02 NOTE — Progress Notes (Signed)
MOB was referred for history of depression/anxiety. * Referral screened out by Clinical Social Worker because none of the following criteria appear to apply: ~ History of anxiety/depression during this pregnancy, or of post-partum depression following prior delivery. ~ Diagnosis of anxiety and/or depression within last 3 years OR * MOB's symptoms currently being treated with medication and/or therapy. Per further chart review, it is noted that MOB is on Prozac and has active prescription for Wellbutrin. CSW also noted that MOB has a therapist.    Please contact the Clinical Social Worker if needs arise, by [facility] request, or if MOB scores greater than 9/yes to question 10 on Edinburgh Postpartum Depression Screen.   Claude Manges Rithvik Orcutt, MSW, LCSW [facility] [phone]

## 2020-09-02 NOTE — Progress Notes (Signed)
PPD # 1 S/P NSVD  Live born female  Birth Weight: 7 lb 10.2 oz (3464 g) APGAR: 8, 9  Newborn Delivery   Birth date/time: 09/01/2018 02:35:00 Delivery type: Vaginal, Spontaneous     Baby name: Katrina Phillips Delivering provider: LAW, CASSANDRA A   Episiotomy:None   Lacerations:2nd degree   Circumcision Yes, planning  Feeding: breast, pumping for baby in NICU  Pain control at delivery: None   Subjective   Reports feeling well. Disappointed that baby had to return to NICU for respiratory issues and monitoring. Pumping and hand feeding. Pain is well controlled and bleeding is moderate.              Tolerating po/ No nausea or vomiting             Bleeding is moderate             Pain controlled with ibuprofen (OTC)             Up ad lib / ambulatory / voiding without difficulties   Objective   A & O x 3, in no apparent distress              VS:  Vitals:   09/01/20 0634 09/01/20 1216 09/01/20 1550 09/02/20 0600  BP: 123/70 106/64 120/72 110/72  Pulse: 62 76 73 68  Resp: 16 16 17 16   Temp: 98.1 F (36.7 C)  98.2 F (36.8 C) 98 F (36.7 C)  TempSrc: Oral  Oral Oral  SpO2: 98% 100% 100%   Weight:      Height:        LABS:  Recent Labs    09/01/20 0039 09/01/20 0652  WBC 12.3* 20.4*  HGB 11.2* 11.1*  HCT 35.0* 34.3*  PLT 261 267     Blood type: --/--/A POS (11/21 0039)  Rubella: Immune (05/12 0000)   Vaccines:   TDaP   UTD                   Flu       UTD                             COVID-19 UTD   Gen: AAO x 3, NAD  Abdomen: soft, non-tender, non-distended             Fundus: firm, non-tender, U-1  Perineum: repair intact, no edema  Lochia: moderate  Extremities: no edema, no calf pain or tenderness   Assessment/Plan PPD # 1 39 y.o., 01-07-1989   Principal Problem:   Postpartum care following vaginal delivery 11/21  Doing well - stable status  Routine post partum orders Active Problems:   Depression with anxiety  Continue Prozac 40mg   Return to office in 2  weeks for mood check   Attention deficit hyperactivity disorder (ADHD)   Normal spontaneous vaginal delivery   Second degree perineal laceration  Discussed perineal care and comfort measures.   GDM, class A2  Continue Metformin 1000mg    Positive GBS test  Anticipate discharge tomorrow   12/21, MSN, CNM 09/02/2020, 11:00 AM

## 2020-09-02 NOTE — Lactation Note (Signed)
This note was copied from a baby's chart. Lactation Consultation Note  Patient Name: Katrina Phillips Date: 09/02/2020 Reason for consult: Follow-up assessment;Early term 37-38.6wks;1st time breastfeeding;Primapara;NICU baby  P2 mother whose infant is now 62 hours old.  This is an ETI at 38+3 weeks.  Mother's first child was stillborn.    Baby was admitted to the NICU at 10 hours of life due to desaturations in the nursery.  Once in the NICU, baby had no further respiratory distress and was transferred back to mother's room at 2330 on 09/01/20.  This morning dayshift RN observed some duskiness and oxygen saturations were 90-92%.  Further desaturations noted in the nursery and baby transferred back to the NICU in couplet care early this a.m.  RN had called for a curved tip syringe.  When I arrived, baby was asleep in the open warmer.  Mother had 10 mls of EBM pumped at bedside.  After speaking with the RN I received permission to feed back this EBM.  Mother held swaddled infant while I demonstrated feeding with the curved tip syringe.  Baby consumed the 10 mls easily without any distress.  Praised mother for her continued efforts with pumping.  Father present.  Rn updated.    Maternal Data    Feeding Feeding Type: Breast Milk  LATCH Score Latch: Grasps breast easily, tongue down, lips flanged, rhythmical sucking.  Audible Swallowing: A few with stimulation  Type of Nipple: Everted at rest and after stimulation  Comfort (Breast/Nipple): Soft / non-tender  Hold (Positioning): Assistance needed to correctly position infant at breast and maintain latch.  LATCH Score: 8  Interventions    Lactation Tools Discussed/Used     Consult Status Consult Status: Follow-up Date: 09/03/20 Follow-up type: In-patient    Katrina Phillips Symantha Steeber 09/02/2020, 1:50 PM

## 2020-09-03 MED ORDER — IBUPROFEN 600 MG PO TABS: 600.0000 mg | ORAL_TABLET | Freq: Four times a day (QID) | ORAL | 0 refills | Status: DC

## 2020-09-03 NOTE — Progress Notes (Signed)
CSW received consult due to score 12 on Edinburgh Depression Screen and hx of anxiety depression.  When CSW arrived to room 328, MOB and FOB were sitting on the bed, infant was asleep in his isolette, MGM was resting in the recliner.  Everyone appeared happy and comfortable. CSW explained CSW's role and MOB gave CSW permission to complete the assessment while MOB's guest were present. Without prompting MOB and FOB happily reported that infant will be discharging with MOB today. The couple reported having all essential items to care for infant and feeling prepared for infant's discharge.   CSW asked about MOB's MH hx.  MOB reported that she was dx with anxiety and depression around age 39 and symtoms have been continually managed with medication and outpatient counseling. MOB shared she is currently taking Prozac and meets regularly with therapist [facility]. MOB also reported having a good supportive team and feeling comfortable seeking help if help is needed. CSW reviewed MOB's Edinburgh results and MOB stated, "I attributed my responses finding out that my baby had to be transferred back to the NICU and also struggling with breastfeeding in the moment."  MOB also shared that since giving birth she has also experienced some sadness related to her stillbirth last year.  CSW validated and normalized MOB's thoughts and feelings.   CSW provided education regarding Baby Blues vs PMADs and provided MOB with resources for mental health follow up.  CSW encouraged MOB to evaluate her mental health throughout the postpartum period with the use of the New Mom Checklist developed by Postpartum [employer] as well as the [place] Postnatal Depression Scale and notify a medical professional if symptoms arise.  MOB presented with insight and awareness and did not demonstrate any acute MH symptoms.   CSW thanked the family for meeting with CSW and CSW provided the family with CSW contact information just in case it is  needed during the postpartum period.   Blaine Hamper, MSW, LCSW Clinical Social Work [phone]

## 2020-09-03 NOTE — Lactation Note (Signed)
This note was copied from a baby's chart. Lactation Consultation Note  Patient Name: Boy Sammi Stolarz [id number] Date: 09/03/2020 Reason for consult: Follow-up assessment;NICU baby;1st time breastfeeding LC to infant's room for f/u visit with parents. Mother has +breast changes and is pumping without difficulty or pain. Baby's output and wt loss are wnl. Baby may d/c today. This LC provided discharge teaching and reviewed feeding and output expectations over the next 24 hours. Parents to f/u with Peds tomorrow. Mom has a pump to use at home prn and is aware of community resources for continued bf support. Will plan follow up visit prn.  Feeding Feeding Type: Bottle Fed - Breast Milk Nipple Type: Nfant Extra Slow Flow (gold)   Interventions Interventions: Breast feeding basics reviewed;Skin to skin;Expressed milk;Coconut oil;Breast massage;Hand express;Breast compression;DEBP;Ice  Lactation Tools Discussed/Used     Consult Status Consult Status: Follow-up Date: 09/04/20 Follow-up type: In-patient    Elder Negus 09/03/2020, 10:33 AM

## 2020-09-03 NOTE — Discharge Summary (Signed)
Postpartum Discharge Summary  Date of Service updated 09/03/2020     Patient Name: Katrina Phillips DOB: 1981/10/10 MRN: [id number]  Date of admission: 08/31/2020 Delivery date:09/01/2020  Delivering provider: Langley Gauss A  Date of discharge: 09/03/2020  Admitting diagnosis: Normal labor [O80, Z37.9] Intrauterine pregnancy: [redacted]w[redacted]d    Secondary diagnosis:  Principal Problem:   Postpartum care following vaginal delivery 11/21 Active Problems:   Depression with anxiety   Attention deficit hyperactivity disorder (ADHD)   Normal spontaneous vaginal delivery   Second degree perineal laceration   GDM, class A2   Positive GBS test  Additional problems: PCOS    Discharge diagnosis: Term Pregnancy Delivered and GDM A2                                              Post partum procedures:none Augmentation: N/A Complications: [id number]>3729MSbut per note QBL AND LARGE amniotic fluid volume   Hospital course: Onset of Labor With Vaginal Delivery      39y.o. yo G3P2011 at 37w3das admitted in Active Labor on 08/31/2020. Patient had an uncomplicated labor course as follows:  Membrane Rupture Time/Date: 1:35 AM ,09/01/2020   Delivery Method:Vaginal, Spontaneous  Episiotomy: None  Lacerations:  2nd degree  Patient had an uncomplicated postpartum course.  She is ambulating, tolerating a regular diet, passing flatus, and urinating well. Patient is discharged home in stable condition on 09/03/20.  Newborn Data: Birth date:09/01/2020  Birth time:2:35 AM  Gender:Female  Living status:Living  Apgars:8 ,9  [id number]   Magnesium Sulfate received: No BMZ received: No Rhophylac:N/A MMR:N/A T-DaP:Given prenatally Flu: Yes  COVID-19: UTD Transfusion:No  Physical exam  Vitals:   09/02/20 0600 09/02/20 1325 09/02/20 2115 09/03/20 0550  BP: 110/72 113/70 114/72 122/78  Pulse: 68 66 67 72  Resp: '16 16 17 17  ' Temp: 98 F (36.7 C) 98 F (36.7 C) 98 F (36.7 C) 97.7 F (36.5  C)  TempSrc: Oral Oral Oral Axillary  SpO2:  97% 98% 98%  Weight:      Height:       General: alert, cooperative and no distress Lochia: appropriate Uterine Fundus: firm, below umbilicus Perineum: well approximated 2nd degree; large ecchymosis noted covering buttocks, but no evidence of hematoma, non-tender on exam DVT Evaluation: No evidence of DVT seen on physical exam. No significant calf/ankle edema. Labs: Lab Results  Component Value Date   WBC 20.4 (H) 09/01/2020   HGB 11.1 (L) 09/01/2020   HCT 34.3 (L) 09/01/2020   MCV 94.2 09/01/2020   PLT 267 09/01/2020   CMP Latest Ref Rng & Units 04/13/2018  Glucose 70 - 99 mg/dL 100(H)  BUN 6 - 23 mg/dL 10  Creatinine 0.40 - 1.20 mg/dL 0.69  Sodium 135 - 145 mEq/L 140  Potassium 3.5 - 5.1 mEq/L 4.8  Chloride 96 - 112 mEq/L 104  CO2 19 - 32 mEq/L 30  Calcium 8.4 - 10.5 mg/dL 9.0   Edinburgh Score: Edinburgh Postnatal Depression Scale Screening Tool 09/02/2020  I have been able to laugh and see the funny side of things. 0  I have looked forward with enjoyment to things. 0  I have blamed myself unnecessarily when things went wrong. 2  I have been anxious or worried for no good reason. 2  I have felt scared or panicky for no good reason. 2  Things have been getting on top of me. 2  I have been so unhappy that I have had difficulty sleeping. 0  I have felt sad or miserable. 2  I have been so unhappy that I have been crying. 1  The thought of harming myself has occurred to me. 0  Edinburgh Postnatal Depression Scale Total 11      After visit meds:  Allergies as of 09/03/2020   No Known Allergies     Medication List    STOP taking these medications   aspirin EC 81 MG tablet   buPROPion 300 MG 24 hr tablet Commonly known as: WELLBUTRIN XL   calcium carbonate 500 MG chewable tablet Commonly known as: TUMS - dosed in mg elemental calcium   ferrous sulfate 325 (65 FE) MG tablet     TAKE these medications    acetaminophen 500 MG tablet Commonly known as: TYLENOL Take 500 mg by mouth every 6 (six) hours as needed for headache.   FLUoxetine 20 MG tablet Commonly known as: PROZAC Take 1 tablet (20 mg total) by mouth daily. What changed: how much to take   ibuprofen 600 MG tablet Commonly known as: ADVIL Take 1 tablet (600 mg total) by mouth every 6 (six) hours.   metFORMIN 1000 MG tablet Commonly known as: GLUCOPHAGE Take 1,000 mg by mouth 2 (two) times daily with a meal.   PRENATAL PO Take 1 tablet by mouth daily.            Discharge Care Instructions  (From admission, onward)         Start     Ordered   09/03/20 0000  Discharge wound care:       Comments: Warm water sitz baths as needed   09/03/20 1359           Discharge home in stable condition Infant Feeding: Breast Infant Disposition:home with mother Discharge instruction: per After Visit Summary and Postpartum booklet. Activity: Advance as tolerated. Pelvic rest for 6 weeks.  Diet: low salt diet Anticipated Birth Control: Unsure Postpartum Appointment:2 weeks Additional Postpartum F/U: Postpartum Depression checkup Future Appointments: Future Appointments  Date Time Provider [place]  09/07/2020  9:45 AM [facility] None   Follow up Visit:  Follow-up Information    Aloha Gell, MD. Schedule an appointment as soon as possible for a visit in 2 week(s).   Specialty: Obstetrics and Gynecology Why: Postpartum check Contact information: [place] [address] [phone]                   09/03/2020 Darliss Cheney, CNM

## 2020-09-04 ENCOUNTER — Ambulatory Visit: Payer: Self-pay

## 2020-09-04 NOTE — Lactation Note (Signed)
This note was copied from a baby's chart. Lactation Consultation Note  Patient Name: Katrina Phillips [id number] Date: 09/04/2020 Reason for consult: Difficult latch  LC visit to 31 hours old infant of a P1 mother. Infant is currently at Pediatric ED due to lethargy and difficulty feeding. Infant has a total weight loss of 4.73% since birth. Parents explain breast milk has transitioned from colostrum already. Mother reports firm, full breasts and leaks at feedings. Mother continues pumping 4-5 times a day using initiation setting as instructed while infant was in NICU.   Assisted mother with a modified cradle latch to left breast. Infant unable to sustain latch, pops on and off breast. After a few attempts, infant was able to latch. Noted rhythmic suckling and swallowing, breast tissue movement and mother denies pain. Assisted with burping upon unlatching and then relatched. Infant looks relaxed and uninterested at breast. Mother placed baby skin to skin.   Mother has her personal breast pump at home. Offered a manual pump to help empty breasts while at pediatric ED. Demonstrated how to use it. Mature milk flows easily.   Shared breastfeeding online resources available. Provided [facility] services brochure for questions or to request outpatient appointment. Reviewed "Caring for your LPTI" green sheet due to infant's age at birth.  Infant woke up and started cueing. Attempted latch. Infant has difficulty to sustain latch. Talked to parents about using a nipple shield. Mother explains she has been using one at home and seems to help with latch. Placed 24 mm nipple shield to right nipple and assisted with latch. Baby was able to latch successfully after a couple of attempts. Observed NS full of breast milk. Infant gulping and continued breastfeeding.  Feeding plan:  1. Breastfeed following hunger cues.  2. Offer breast 8 - 12 times in 24h period to establish good milk supply.   3. Pump as needed using  maintenance setting for more minutes after her letdown.  4. If needed supplement with EBM following guidelines and fullness cues.   5. Encouraged maternal rest, hydration and food intake.  6. Contact Lactation Services or local resources for support, questions or concerns.    Praised mother for her milk supply, effort and dedication. Encouraged to contact [facility] for support when ready to breastfeed baby and recommended to request help for questions or concerns.    All questions answered at this time. Infant is still breastfeeding upon LC leaving room.    Maternal Data Formula Feeding for Exclusion: No Has patient been taught Hand Expression?: Yes Does the patient have breastfeeding experience prior to this delivery?: No  Feeding Feeding Type: Breast Fed  LATCH Score Latch: Grasps breast easily, tongue down, lips flanged, rhythmical sucking.  Audible Swallowing: Spontaneous and intermittent  Type of Nipple: Everted at rest and after stimulation (short shaft)  Comfort (Breast/Nipple): Filling, red/small blisters or bruises, mild/mod discomfort  Hold (Positioning): Assistance needed to correctly position infant at breast and maintain latch.  LATCH Score: 8  Interventions Interventions: Breast feeding basics reviewed;Assisted with latch;Skin to skin;Breast massage;Hand express;Pre-pump if needed;Adjust position;Hand pump;Expressed milk;Position options;Support pillows  Lactation Tools Discussed/Used Tools: Pump;Flanges;Nipple Shields Nipple shield size: 24 Flange Size: 30 Breast pump type: Manual   Consult Status Consult Status: Complete Date: 09/04/20 Follow-up type: Call as needed    Merton Wadlow A Higuera Ancidey 09/04/2020, 4:03 PM

## 2020-09-05 ENCOUNTER — Ambulatory Visit: Payer: Self-pay

## 2020-09-05 NOTE — Lactation Note (Signed)
This note was copied from a baby's chart. Lactation Consultation Note  Patient Name: Katrina Phillips Date: 09/05/2020 Reason for consult:  (readmit)  LC Zella Ball got a call from the Peds unit to see this patient due to poor feedings, but by the time this LC arrived to the Peds unit, mom has already left for the day. Spoke to [employer] and she reported to [facility] that mom may come back later today and to wait for FOB to come back to the room. She also noted that baby is doing a lot better with his feedings but that mom has requested to see lactation again.  Baby is currently at 5% weight loss and mom is pumping every 3 hours, and getting about 30-45 ml per pumping session. Parents have been supplementing baby with mom's EBM; FOB voiced this is baby # 2 but that baby # 1 was stillborn, so this is mom's first time BF.  Mom has been using the NS # 24 at every feeding and per dad and RN is has been working, baby "likes it".  Encouraged to keep putting baby to breast, keep pumping every 3 hours and continue supplementing baby with EBM at every feeding. FOB aware to call when mom comes back in the room and she's ready to work with lactation.      Maternal Data    Feeding Feeding Type: Bottle Fed - Breast Milk Nipple Type: Slow - flow  LATCH Score                   Interventions    Lactation Tools Discussed/Used     Consult Status      Katrina Phillips 09/05/2020, 6:11 PM

## 2020-09-07 ENCOUNTER — Other Ambulatory Visit (HOSPITAL_COMMUNITY): Payer: PRIVATE HEALTH INSURANCE

## 2020-09-09 ENCOUNTER — Inpatient Hospital Stay (HOSPITAL_COMMUNITY): Payer: PRIVATE HEALTH INSURANCE

## 2020-09-09 ENCOUNTER — Inpatient Hospital Stay (HOSPITAL_COMMUNITY)
Admission: AD | Admit: 2020-09-09 | Payer: PRIVATE HEALTH INSURANCE | Source: Home / Self Care | Admitting: Obstetrics and Gynecology

## 2021-04-07 ENCOUNTER — Ambulatory Visit: Payer: Self-pay | Admitting: Family Medicine

## 2021-06-23 ENCOUNTER — Encounter: Payer: Self-pay | Admitting: Family Medicine

## 2021-06-23 ENCOUNTER — Ambulatory Visit: Payer: BC Managed Care – PPO | Admitting: Family Medicine

## 2021-06-23 ENCOUNTER — Other Ambulatory Visit: Payer: Self-pay

## 2021-06-23 VITALS — BP 110/72 | HR 78 | Temp 98.2°F | Ht 69.0 in | Wt 173.2 lb

## 2021-06-23 DIAGNOSIS — R202 Paresthesia of skin: Secondary | ICD-10-CM

## 2021-06-23 DIAGNOSIS — R198 Other specified symptoms and signs involving the digestive system and abdomen: Secondary | ICD-10-CM

## 2021-06-23 DIAGNOSIS — F418 Other specified anxiety disorders: Secondary | ICD-10-CM

## 2021-06-23 DIAGNOSIS — Z23 Encounter for immunization: Secondary | ICD-10-CM

## 2021-06-23 DIAGNOSIS — Z8632 Personal history of gestational diabetes: Secondary | ICD-10-CM

## 2021-06-23 NOTE — Progress Notes (Signed)
   Subjective:    Patient ID: Cyndia Bent, female    DOB: 06/17/1981, 40 y.o.   MRN: [id number]  HPI Chief Complaint  Patient presents with   Establish Care    Gestational diabetes, hands and feet itching/pins and needle feeling, back and abdominal pain, easily fatigued since giving birth 9 months ago   She is new to the practice and here to establish care.   PCP at [facility].  Other providers: OB/GYN at [facility].  Complains of a 1 month hx of "itchy and prickly" sensation of her hands and feet. Tingling in hands.  Hx of vitamin B def. Is not on a supplemental.  No rash.   Allergies- is not on medication.  Has taken Xyzal.    Gave birth 9 months ago to her son, vaginal birth.  Stillborn daughter in 2020.   Gestational diabetes with both children.   No unexplained weight loss. No polydipsia, polyuria.   Reports having weakness in her abdomen and back and tires quickly with her core. This has been ongoing for the past 9 months since giving birth.   Denies fever, chills, dizziness, chest pain, palpitations, shortness of breath, abdominal pain, N/V/D, urinary symptoms, LE edema.    Takes fluoxetine daily. Prescribed by her psych NP. Anxiety and depression.   LEEP procedure in the past.   Married. Works as a Camera operator.      Review of Systems Pertinent positives and negatives in the history of present illness.     Objective:   Physical Exam BP 110/72 (BP Location: Left Arm, Patient Position: Sitting, Cuff Size: Normal)   Pulse 78   Temp 98.2 F (36.8 C) (Oral)   Ht 5\' 9"  (1.753 m)   Wt 173 lb 3.2 oz (78.6 kg)   SpO2 98%   Breastfeeding Yes   BMI 25.58 kg/m   Alert and in no distress. Cardiac exam shows a regular sinus rhythm without murmurs or gallops. Lungs are clear to auscultation. Abdomen soft, non distended, normal BS, non tender, no guarding. Skin is warm and dry. No LE edema.       Assessment & Plan:  Paresthesias -  Plan: CBC with Differential/Platelet, Comprehensive metabolic panel, TSH, T4, free, T3, Vitamin B12, Iron Unclear etiology. May try a nondrowsy antihistamine. Follow up pending labs.   History of gestational diabetes - Plan: Hemoglobin A1c -follow up pending A1c  Depression with anxiety -controlled. Monitored by mental health provider   Abdominal weakness - Plan: Ambulatory referral to Physical Therapy -recommend core strengthening per PT eval and follow up with OB/GYN  Needs flu shot - Plan: Flu Vaccine QUAD 52mo+IM (Fluarix, Fluzone & Alfiuria Quad PF)

## 2021-06-24 LAB — CBC WITH DIFFERENTIAL/PLATELET
Basophils Absolute: 0 10*3/uL (ref 0.0–0.2)
Basos: 1 %
EOS (ABSOLUTE): 0.1 10*3/uL (ref 0.0–0.4)
Eos: 2 %
Hematocrit: 41.1 % (ref 34.0–46.6)
Hemoglobin: 13.5 g/dL (ref 11.1–15.9)
Immature Grans (Abs): 0 10*3/uL (ref 0.0–0.1)
Immature Granulocytes: 0 %
Lymphocytes Absolute: 1.5 10*3/uL (ref 0.7–3.1)
Lymphs: 24 %
MCH: 31 pg (ref 26.6–33.0)
MCHC: 32.8 g/dL (ref 31.5–35.7)
MCV: 94 fL (ref 79–97)
Monocytes Absolute: 0.4 10*3/uL (ref 0.1–0.9)
Monocytes: 6 %
Neutrophils Absolute: 4.2 10*3/uL (ref 1.4–7.0)
Neutrophils: 67 %
Platelets: 290 10*3/uL (ref 150–450)
RBC: 4.36 x10E6/uL (ref 3.77–5.28)
RDW: 12.3 % (ref 11.7–15.4)
WBC: 6.3 10*3/uL (ref 3.4–10.8)

## 2021-06-24 LAB — COMPREHENSIVE METABOLIC PANEL
ALT: 12 IU/L (ref 0–32)
AST: 10 IU/L (ref 0–40)
Albumin/Globulin Ratio: 2 (ref 1.2–2.2)
Albumin: 4.2 g/dL (ref 3.8–4.8)
Alkaline Phosphatase: 66 IU/L (ref 44–121)
BUN/Creatinine Ratio: 18 (ref 9–23)
BUN: 12 mg/dL (ref 6–20)
Bilirubin Total: 0.4 mg/dL (ref 0.0–1.2)
CO2: 25 mmol/L (ref 20–29)
Calcium: 9.3 mg/dL (ref 8.7–10.2)
Chloride: 100 mmol/L (ref 96–106)
Creatinine, Ser: 0.66 mg/dL (ref 0.57–1.00)
Globulin, Total: 2.1 g/dL (ref 1.5–4.5)
Glucose: 85 mg/dL (ref 65–99)
Potassium: 4.6 mmol/L (ref 3.5–5.2)
Sodium: 139 mmol/L (ref 134–144)
Total Protein: 6.3 g/dL (ref 6.0–8.5)
eGFR: 115 mL/min/{1.73_m2} (ref >59)

## 2021-06-24 LAB — T3: T3, Total: 115 ng/dL (ref 71–180)

## 2021-06-24 LAB — T4, FREE: Free T4: 1.01 ng/dL (ref 0.82–1.77)

## 2021-06-24 LAB — HEMOGLOBIN A1C
Est. average glucose Bld gHb Est-mCnc: 123 mg/dL
Hgb A1c MFr Bld: 5.9 % — ABNORMAL HIGH (ref 4.8–5.6)

## 2021-06-24 LAB — VITAMIN B12: Vitamin B-12: 289 pg/mL (ref 232–1245)

## 2021-06-24 LAB — IRON: Iron: 140 ug/dL (ref 27–159)

## 2021-06-24 LAB — TSH: TSH: 0.8 u[IU]/mL (ref 0.450–4.500)

## 2021-07-24 DIAGNOSIS — M6208 Separation of muscle (nontraumatic), other site: Secondary | ICD-10-CM | POA: Diagnosis not present

## 2021-07-28 DIAGNOSIS — M6208 Separation of muscle (nontraumatic), other site: Secondary | ICD-10-CM | POA: Diagnosis not present

## 2021-07-30 DIAGNOSIS — M6208 Separation of muscle (nontraumatic), other site: Secondary | ICD-10-CM | POA: Diagnosis not present

## 2021-08-05 DIAGNOSIS — M6208 Separation of muscle (nontraumatic), other site: Secondary | ICD-10-CM | POA: Diagnosis not present

## 2021-08-06 DIAGNOSIS — M6208 Separation of muscle (nontraumatic), other site: Secondary | ICD-10-CM | POA: Diagnosis not present

## 2021-08-11 DIAGNOSIS — M6208 Separation of muscle (nontraumatic), other site: Secondary | ICD-10-CM | POA: Diagnosis not present

## 2021-08-13 DIAGNOSIS — M6208 Separation of muscle (nontraumatic), other site: Secondary | ICD-10-CM | POA: Diagnosis not present

## 2021-08-15 ENCOUNTER — Other Ambulatory Visit: Payer: Self-pay

## 2021-08-15 ENCOUNTER — Encounter: Payer: Self-pay | Admitting: Obstetrics and Gynecology

## 2021-08-15 ENCOUNTER — Ambulatory Visit: Payer: BC Managed Care – PPO | Admitting: Obstetrics and Gynecology

## 2021-08-15 VITALS — BP 116/74

## 2021-08-15 DIAGNOSIS — N760 Acute vaginitis: Secondary | ICD-10-CM

## 2021-08-15 LAB — WET PREP FOR TRICH, YEAST, CLUE

## 2021-08-15 NOTE — Progress Notes (Signed)
GYNECOLOGY  VISIT   HPI: 40 y.o.   Married White or Caucasian Not Hispanic or Latino  female   [phone] with Patient's last menstrual period was 08/07/2021.   here for  vaginal discharge. She c/o a 3 week h/o a thicker, yellow/white vaginal d/c. Some mild itching. No odor.    GYNECOLOGIC HISTORY: Patient's last menstrual period was 08/07/2021. Contraception:condoms Menopausal hormone therapy: n/a        OB History     Gravida  3   Para  2   Term  2   Preterm  0   AB  1   Living  1      SAB  0   IAB  1   Ectopic  0   Multiple  0   Live Births  1              Patient Active Problem List   Diagnosis Date Noted   Postpartum care following vaginal delivery 11/21 09/02/2020   Second degree perineal laceration 09/02/2020   GDM, class A2 09/02/2020   Positive GBS test 09/02/2020   Normal spontaneous vaginal delivery 09/01/2020   Attention deficit hyperactivity disorder (ADHD) 08/18/2018   Depression with anxiety 04/13/2018    Past Medical History:  Diagnosis Date   Abnormal Pap smear of cervix 03/15/2017   HR HPV: +Detected   ADHD    Allergy    SEASONAL   Anxiety    Asthma    AS A CHILD   Depression    Gestational diabetes    H/O concussion 1995    Past Surgical History:  Procedure Laterality Date   COLPOSCOPY  2006   LEEP     NO PAST SURGERIES     WISDOM TOOTH EXTRACTION      Current Outpatient Medications  Medication Sig Dispense Refill   FLUoxetine (PROZAC) 40 MG capsule Take 40 mg by mouth daily as needed.     No current facility-administered medications for this visit.     ALLERGIES: Patient has no known allergies.  Family History  Problem Relation Age of Onset   ADD / ADHD Mother    GER disease Mother    ADD / ADHD Sister    Depression Brother    ADD / ADHD Brother    Celiac disease Father    Depression Father    Depression Maternal Grandmother    GER disease Maternal Grandfather    Heart disease Maternal Grandfather     Celiac disease Paternal Grandmother    Kidney disease Paternal Grandmother    Celiac disease Paternal Uncle    Melanoma Paternal Aunt     Social History   Socioeconomic History   Marital status: Married    Spouse name: Not on file   Number of children: 0   Years of education: Not on file   Highest education level: Not on file  Occupational History   Occupation: grad student/assistant  Tobacco Use   Smoking status: Never   Smokeless tobacco: Never  Vaping Use   Vaping Use: Never used  Substance and Sexual Activity   Alcohol use: Not Currently    Alcohol/week: 1.0 - 4.0 standard drink    Types: 1 - 4 Standard drinks or equivalent per week    Comment: social   Drug use: No   Sexual activity: Yes    Partners: Male    Birth control/protection: Condom  Other Topics Concern   Not on file  Social History Narrative   Not  on file   Social Determinants of Health   Financial Resource Strain: Not on file  Food Insecurity: Not on file  Transportation Needs: Not on file  Physical Activity: Not on file  Stress: Not on file  Social Connections: Not on file  Intimate Partner Violence: Not on file    ROS  PHYSICAL EXAMINATION:    BP 116/74   LMP 08/07/2021     General appearance: alert, cooperative and appears stated age  Pelvic: External genitalia:  no lesions              Urethra:  normal appearing urethra with no masses, tenderness or lesions              Bartholins and Skenes: normal                 Vagina: normal appearing vagina with an increase in creamy yellow vaginal d/c.               Cervix: no lesions              Chaperone, Kennon Portela, CMA was present for exam.  1. Acute vaginitis - WET PREP FOR TRICH, YEAST, CLUE: negative - SureSwab Advanced Vaginitis, TMA -Declines steroid ointment, can use vaseline

## 2021-08-15 NOTE — Patient Instructions (Signed)

## 2021-08-18 LAB — SURESWAB® ADVANCED VAGINITIS,TMA
CANDIDA SPECIES: NOT DETECTED
Candida glabrata: NOT DETECTED
SURESWAB(R) ADV BACTERIAL VAGINOSIS(BV),TMA: NEGATIVE
TRICHOMONAS VAGINALIS (TV),TMA: NOT DETECTED

## 2021-08-19 DIAGNOSIS — M6208 Separation of muscle (nontraumatic), other site: Secondary | ICD-10-CM | POA: Diagnosis not present

## 2021-08-22 DIAGNOSIS — M6208 Separation of muscle (nontraumatic), other site: Secondary | ICD-10-CM | POA: Diagnosis not present

## 2021-08-25 DIAGNOSIS — M6208 Separation of muscle (nontraumatic), other site: Secondary | ICD-10-CM | POA: Diagnosis not present

## 2021-10-15 NOTE — Progress Notes (Signed)
41 y.o. G85P2011 Married White or Caucasian Not Hispanic or Latino female here for annual exam.   Period Cycle (Days): 35 Period Duration (Days): 5 Period Pattern: Regular Menstrual Flow: Moderate Dysmenorrhea: (!) Mild Dysmenorrhea Symptoms: Cramping  She has had intermittent vaginal itching for 5 months. Negative vaginitis panel in 11/22.  She has occasional deep dyspareunia, positional. Some dryness, using lubrication. Using condoms. Considering having another baby, not sure.   Nursing 1 x a day, at bedtime.    Patient's last menstrual period was 10/20/2021.          Sexually active: Yes.    The current method of family planning is condoms.    Exercising: No.   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 She has never used smokeless tobacco. She reports that she does not currently use alcohol after a past usage of about 1.0 - 4.0 standard drink per week. She reports that she does not use drugs. Son is just over 82 year old. Working as a Veterinary surgeon.   Past Medical History:  Diagnosis Date   Abnormal Pap smear of cervix 03/15/2017   HR HPV: +Detected   ADHD    Allergy    SEASONAL   Anxiety    Asthma    AS A CHILD   Depression    Gestational diabetes    H/O concussion 1995    Past Surgical History:  Procedure Laterality Date   COLPOSCOPY  2006   LEEP     NO PAST SURGERIES     WISDOM TOOTH EXTRACTION      Current Outpatient Medications  Medication Sig Dispense Refill   FLUoxetine (PROZAC) 40 MG capsule Take 40 mg by mouth daily.     No current facility-administered medications for this visit.    Family History  Problem Relation Age of Onset   ADD / ADHD Mother    GER disease Mother    ADD / ADHD Sister    Depression Brother    ADD / ADHD Brother    Celiac  disease Father    Depression Father    Depression Maternal Grandmother    GER disease Maternal Grandfather    Heart disease Maternal Grandfather    Celiac disease Paternal Grandmother    Kidney disease Paternal Grandmother    Celiac disease Paternal Uncle    Melanoma Paternal Aunt     Review of Systems  Genitourinary:        Vaginal itch and painful intercourse   Exam:   BP 116/72    Pulse 78    Ht 5\' 8"  (1.727 m)    Wt 178 lb (80.7 kg)    LMP 10/20/2021    SpO2 98%    BMI 27.06 kg/m   Weight change: @WEIGHTCHANGE @ Height:   Height: 5\' 8"  (172.7 cm)  Ht Readings from Last 3 Encounters:  10/21/21 5\' 8"  (1.727 m)  06/23/21 5\' 9"  (1.753 m)  09/01/20 5\' 9"  (1.753 m)    General appearance: alert, cooperative and appears stated age Head: Normocephalic, without obvious abnormality, atraumatic Neck: no adenopathy, supple, symmetrical, trachea midline and thyroid normal to inspection and palpation Lungs: clear to auscultation bilaterally Cardiovascular: regular rate and rhythm Breasts: normal appearance, no masses or tenderness  Abdomen: soft, non-tender; non distended,  no masses,  no organomegaly Extremities: extremities normal, atraumatic, no cyanosis or edema Skin: Skin color, texture, turgor normal. No rashes or lesions Lymph nodes: Cervical, supraclavicular, and axillary nodes normal. No abnormal inguinal nodes palpated Neurologic: Grossly normal   Pelvic: External genitalia:  no lesions, slight erythema              Urethra:  normal appearing urethra with no masses, tenderness or lesions              Bartholins and Skenes: normal                 Vagina: normal appearing vagina with normal color and discharge, no lesions, moderate blood (on menses)              Cervix: no cervical motion tenderness and no lesions               Bimanual Exam:  Uterus:  normal size, contour, position, consistency, mobility, non-tender              Adnexa: no mass, fullness, tenderness                Rectovaginal: Confirms               Anus:  normal sphincter tone, no lesions    Pelvic floor: not tender    Bladder: not tender  Kennon Portela, CMA chaperoned for the exam.  1. Well woman exam Discussed breast self exam Discussed calcium and vit D intake Labs with primary Start PNV, still using condoms  2. Screening for cervical cancer - Cytology - PAP  3. Dyspareunia, female Mild and positional. Normal exam Call if worsening  4. Subacute vulvitis -Discussed vulvar skin care - WET PREP FOR TRICH, YEAST, CLUE: negative

## 2021-10-21 ENCOUNTER — Encounter: Payer: Self-pay | Admitting: Obstetrics and Gynecology

## 2021-10-21 ENCOUNTER — Other Ambulatory Visit (HOSPITAL_COMMUNITY)
Admission: RE | Admit: 2021-10-21 | Discharge: 2021-10-21 | Disposition: A | Discharge status: Home / Self Care | Payer: BC Managed Care – PPO | Source: Ambulatory Visit | Attending: Obstetrics and Gynecology | Admitting: Obstetrics and Gynecology

## 2021-10-21 ENCOUNTER — Ambulatory Visit (INDEPENDENT_AMBULATORY_CARE_PROVIDER_SITE_OTHER): Payer: BC Managed Care – PPO | Admitting: Obstetrics and Gynecology

## 2021-10-21 ENCOUNTER — Other Ambulatory Visit: Payer: Self-pay

## 2021-10-21 VITALS — BP 116/72 | HR 78 | Ht 68.0 in | Wt 178.0 lb

## 2021-10-21 DIAGNOSIS — Z124 Encounter for screening for malignant neoplasm of cervix: Secondary | ICD-10-CM | POA: Diagnosis not present

## 2021-10-21 DIAGNOSIS — N763 Subacute and chronic vulvitis: Secondary | ICD-10-CM

## 2021-10-21 DIAGNOSIS — Z01419 Encounter for gynecological examination (general) (routine) without abnormal findings: Secondary | ICD-10-CM | POA: Diagnosis not present

## 2021-10-21 DIAGNOSIS — N941 Unspecified dyspareunia: Secondary | ICD-10-CM

## 2021-10-21 DIAGNOSIS — E7800 Pure hypercholesterolemia, unspecified: Secondary | ICD-10-CM | POA: Insufficient documentation

## 2021-10-21 LAB — WET PREP FOR TRICH, YEAST, CLUE

## 2021-10-21 NOTE — Patient Instructions (Signed)

## 2021-10-24 LAB — CYTOLOGY - PAP
Comment: NEGATIVE
Diagnosis: NEGATIVE
High risk HPV: NEGATIVE

## 2021-11-03 IMAGING — US US MFM OB LIMITED
1 series · 15 of 26 positions shown · non-contrast
Comparison: none

[Series 1: us mfm ob limited · 26 acquisitions, 15 frames shown]
[im 1/26]
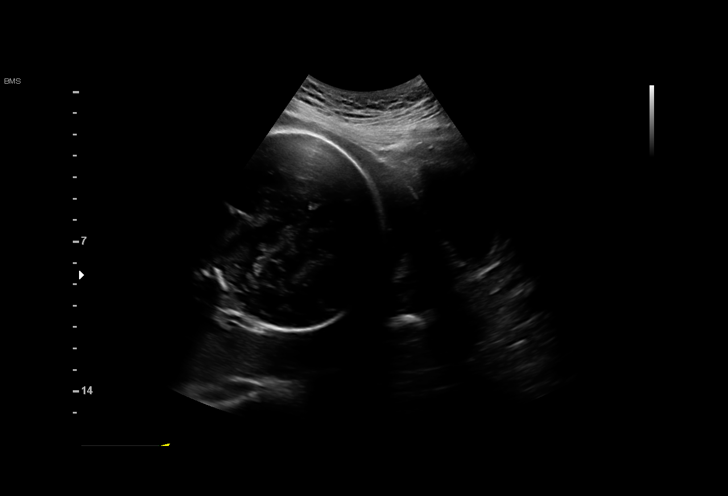
[im 3/26]
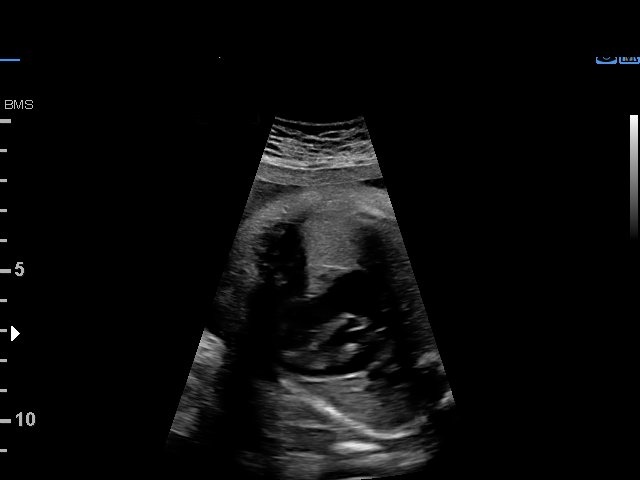
[im 5/26]
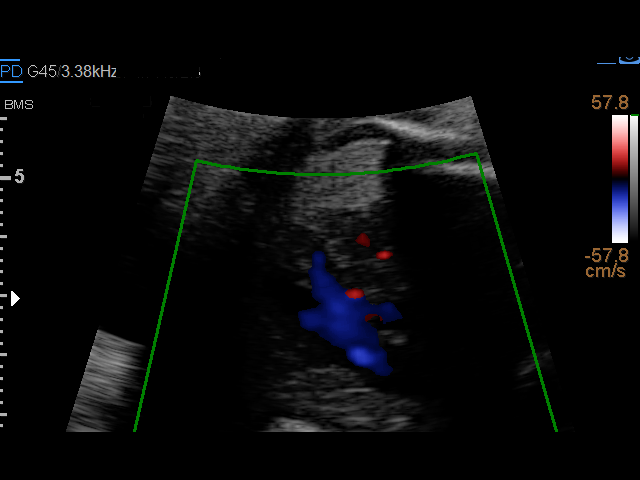
[im 7/26]
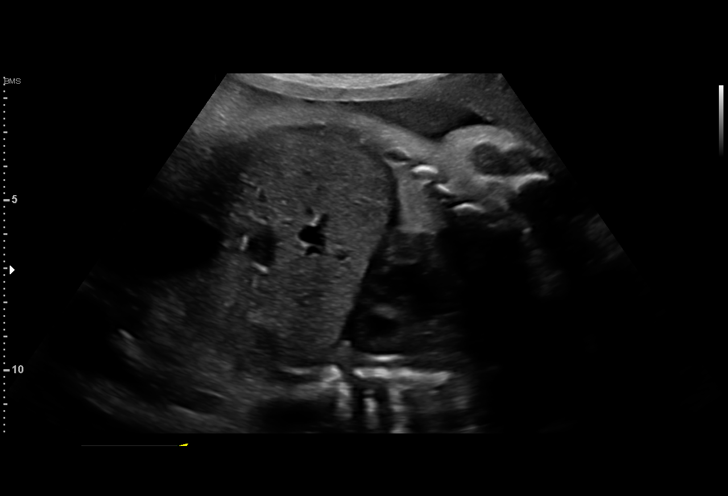
[im 8/26]
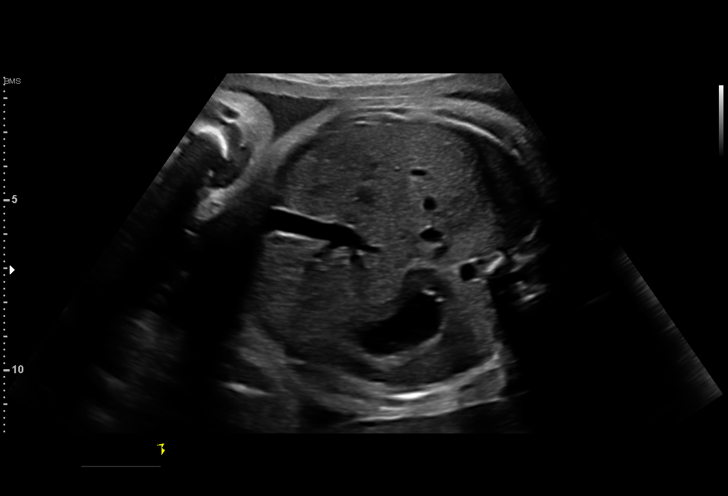
[im 10/26]
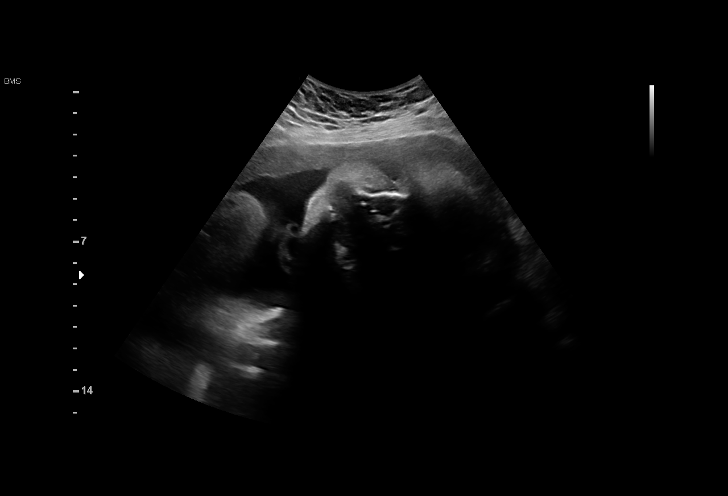
[im 12/26]
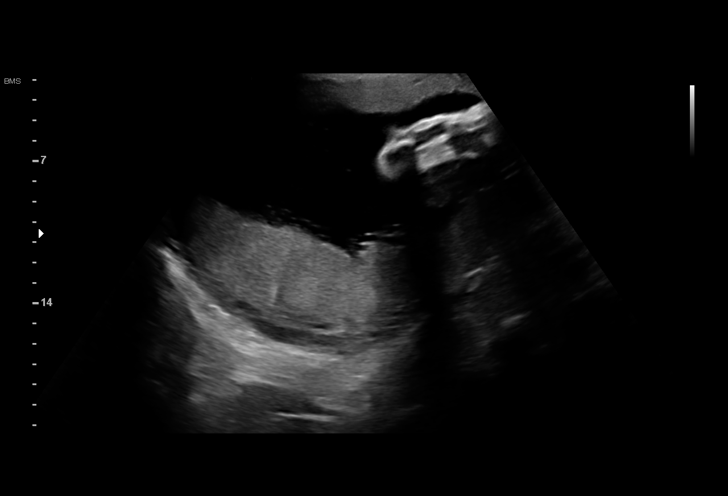
[im 14/26]
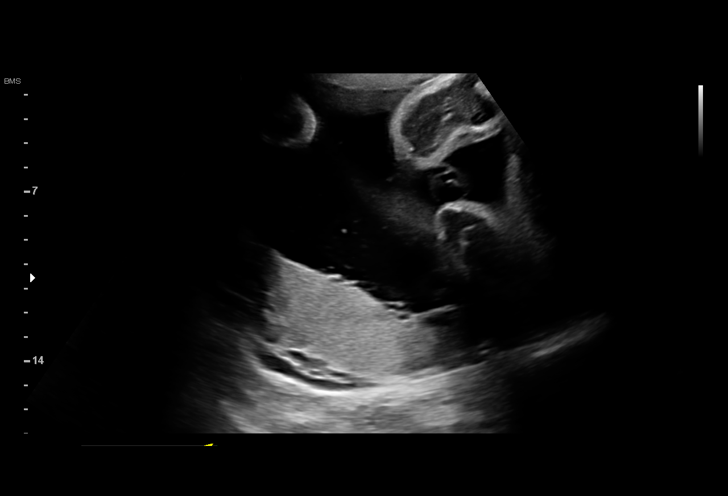
[im 15/26]
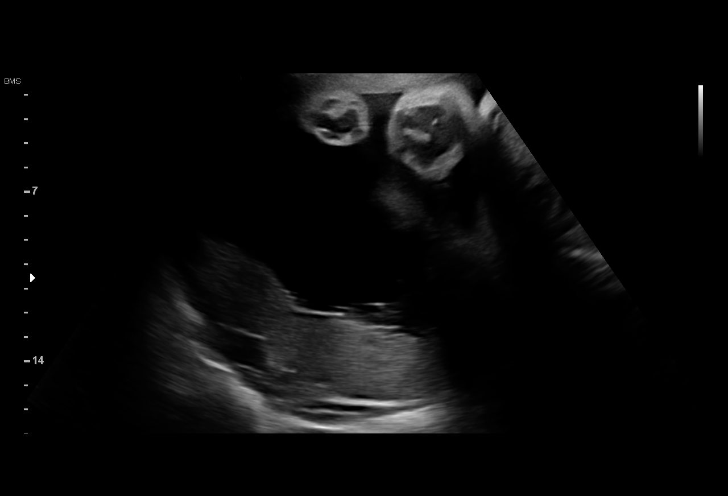
[im 17/26]
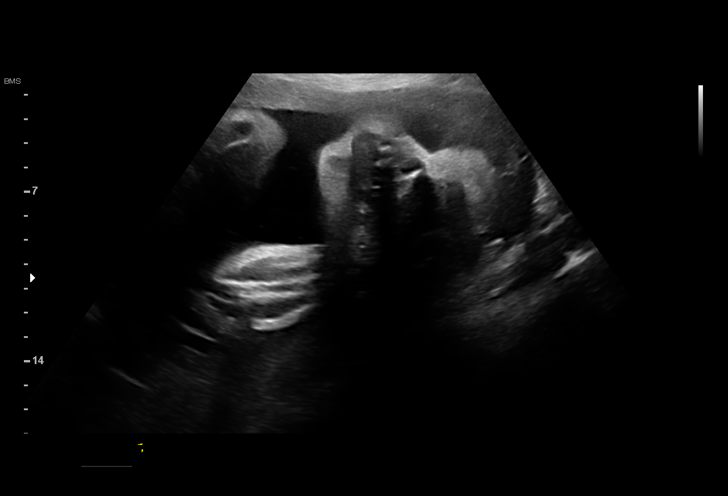
[im 19/26]
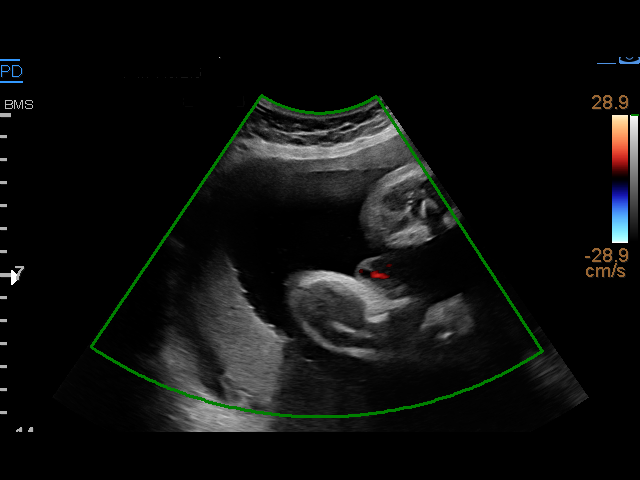
[im 20/26]
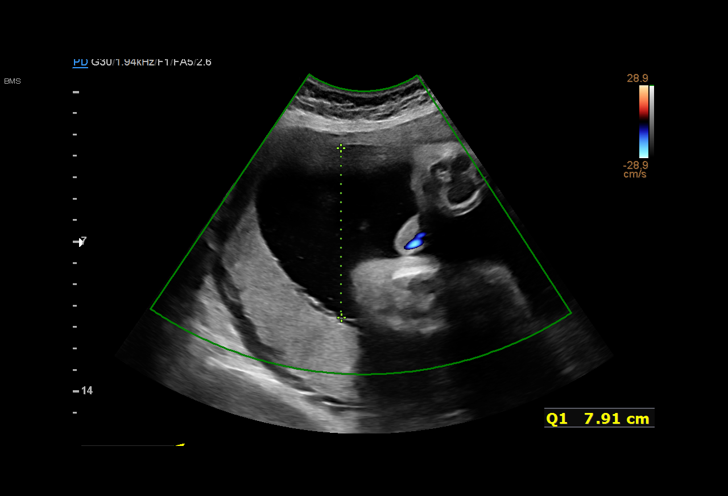
[im 22/26]
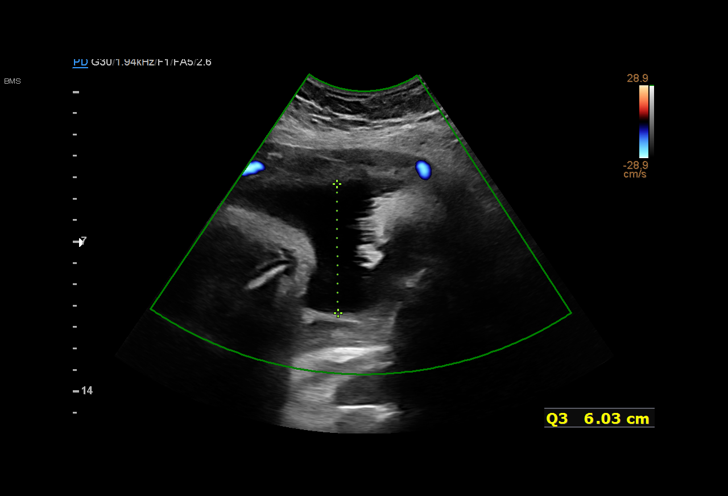
[im 24/26]
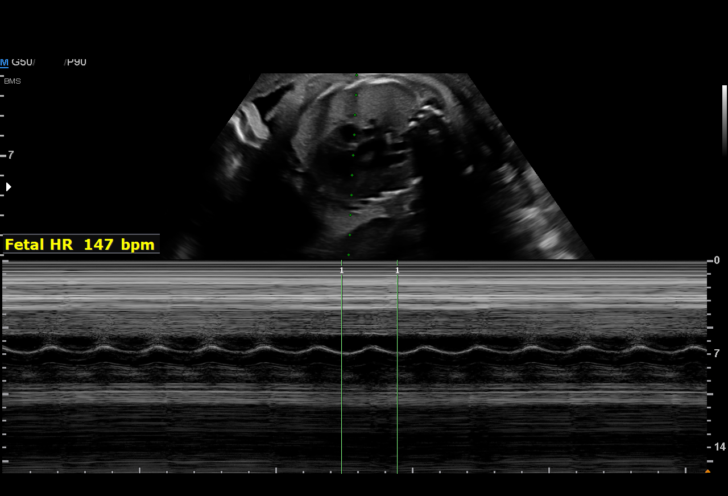
[im 26/26]
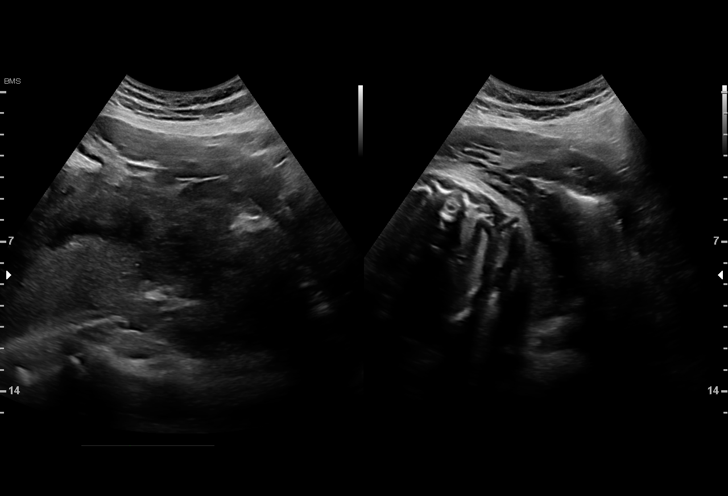

[15 of 26 positions shown; findings below may reference images not displayed]

Indications

 Poor obstetric history: Previous IUFD
 (stillbirth)
 Fetal heart rate decelerations affecting
 management of mother
 32 weeks gestation of pregnancy
Fetal Evaluation

 Num Of Fetuses:         1
 Fetal Heart Rate(bpm):  138
 Cardiac Activity:       Observed
 Presentation:           Cephalic
 Placenta:               Posterior
 P. Cord Insertion:      Not well visualized

 Amniotic Fluid
 AFI FV:      Within normal limits

 AFI Sum(cm)     %Tile       Largest Pocket(cm)
 24.67           95

 RUQ(cm)       RLQ(cm)       LUQ(cm)        LLQ(cm)

Biophysical Evaluation

 Amniotic F.V:   Within normal limits       F. Tone:        Observed
 F. Movement:    Observed                   Score:          [DATE]
 F. Breathing:   Observed
OB History

 Gravidity:    2
 Living:       0
Gestational Age

 Clinical EDD:  32w 6d                                        EDD:   09/12/20
 Best:          32w 6d     Det. By:  Clinical EDD             EDD:   09/12/20
Anatomy

 Diaphragm:             Appears normal         Bladder:                Appears normal
 Stomach:               Appears normal, left
                        sided
Cervix Uterus Adnexa

 Cervix
 Not visualized (advanced GA >89wks)

 Uterus
 No abnormality visualized.

 Right Ovary
 Not visualized.

 Left Ovary
 Within normal limits.

 Cul De Sac
 No free fluid seen.

 Adnexa
 No adnexal mass visualized.
Comments

 This patient had a limited ultrasound performed as variable
 decelerations were noted during her NST in the office.  She
 has a history of a prior IUFD.

 A limited ultrasound performed today shows that the fetus is
 in the vertex presentation.

 There was normal amniotic fluid noted.

 Fetal movements and fetal breathing movements were noted
 on today's exam.

## 2022-03-31 DIAGNOSIS — F339 Major depressive disorder, recurrent, unspecified: Secondary | ICD-10-CM | POA: Diagnosis not present

## 2022-04-17 DIAGNOSIS — F339 Major depressive disorder, recurrent, unspecified: Secondary | ICD-10-CM | POA: Diagnosis not present

## 2022-04-17 DIAGNOSIS — E559 Vitamin D deficiency, unspecified: Secondary | ICD-10-CM | POA: Diagnosis not present

## 2022-04-17 DIAGNOSIS — F411 Generalized anxiety disorder: Secondary | ICD-10-CM | POA: Diagnosis not present

## 2022-04-17 DIAGNOSIS — D519 Vitamin B12 deficiency anemia, unspecified: Secondary | ICD-10-CM | POA: Diagnosis not present

## 2022-04-23 DIAGNOSIS — L503 Dermatographic urticaria: Secondary | ICD-10-CM | POA: Diagnosis not present

## 2022-04-23 DIAGNOSIS — L814 Other melanin hyperpigmentation: Secondary | ICD-10-CM | POA: Diagnosis not present

## 2022-04-23 DIAGNOSIS — D225 Melanocytic nevi of trunk: Secondary | ICD-10-CM | POA: Diagnosis not present

## 2022-05-06 DIAGNOSIS — F339 Major depressive disorder, recurrent, unspecified: Secondary | ICD-10-CM | POA: Diagnosis not present

## 2022-05-27 DIAGNOSIS — F339 Major depressive disorder, recurrent, unspecified: Secondary | ICD-10-CM | POA: Diagnosis not present

## 2022-06-17 ENCOUNTER — Encounter: Payer: Self-pay | Admitting: Internal Medicine

## 2022-06-24 DIAGNOSIS — F339 Major depressive disorder, recurrent, unspecified: Secondary | ICD-10-CM | POA: Diagnosis not present

## 2022-07-21 ENCOUNTER — Encounter: Payer: Self-pay | Admitting: Internal Medicine

## 2022-08-05 DIAGNOSIS — F339 Major depressive disorder, recurrent, unspecified: Secondary | ICD-10-CM | POA: Diagnosis not present

## 2022-08-25 ENCOUNTER — Encounter: Payer: Self-pay | Admitting: Internal Medicine

## 2022-08-31 DIAGNOSIS — F339 Major depressive disorder, recurrent, unspecified: Secondary | ICD-10-CM | POA: Diagnosis not present

## 2022-10-20 NOTE — Progress Notes (Deleted)
42 y.o. G25P2011 Married White or Caucasian Not Hispanic or Latino female here for annual exam.      No LMP recorded.          Sexually active: {yes no:314532}  The current method of family planning is {contraception:315051}.    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 She has never used smokeless tobacco. She reports that she does not currently use alcohol after a past usage of about 1.0 - 4.0 standard drink of alcohol per week. She reports that she does not use drugs.  Past Medical History:  Diagnosis Date   Abnormal Pap smear of cervix 03/15/2017   HR HPV: +Detected   ADHD    Allergy    SEASONAL   Anxiety    Asthma    AS A CHILD   Depression    Gestational diabetes    H/O concussion 1995    Past Surgical History:  Procedure Laterality Date   COLPOSCOPY  2006   LEEP     NO PAST SURGERIES     WISDOM TOOTH EXTRACTION      Current Outpatient Medications  Medication Sig Dispense Refill   FLUoxetine (PROZAC) 40 MG capsule Take 40 mg by mouth daily.     No current facility-administered medications for this visit.    Family History  Problem Relation Age of Onset   ADD / ADHD Mother    GER disease Mother    ADD / ADHD Sister    Depression Brother    ADD / ADHD Brother    Celiac disease Father    Depression Father    Depression Maternal Grandmother    GER disease Maternal Grandfather    Heart disease Maternal Grandfather    Celiac disease Paternal Grandmother    Kidney disease Paternal Grandmother    Celiac disease Paternal Uncle    Melanoma Paternal Aunt     Review of Systems  Exam:   There were no vitals taken for this visit.  Weight change: @WEIGHTCHANGE$ @ Height:      Ht  Readings from Last 3 Encounters:  10/21/21 5' 8"$  (1.727 m)  06/23/21 5' 9"$  (1.753 m)  09/01/20 5' 9"$  (1.753 m)    General appearance: alert, cooperative and appears stated age Head: Normocephalic, without obvious abnormality, atraumatic Neck: no adenopathy, supple, symmetrical, trachea midline and thyroid {CHL AMB PHY EX THYROID NORM DEFAULT:[phone]::"normal to inspection and palpation"} Lungs: clear to auscultation bilaterally Cardiovascular: regular rate and rhythm Breasts: {Exam; breast:13139::"normal appearance, no masses or tenderness"} Abdomen: soft, non-tender; non distended,  no masses,  no organomegaly Extremities: extremities normal, atraumatic, no cyanosis or edema Skin: Skin color, texture, turgor normal. No rashes or lesions Lymph nodes: Cervical, supraclavicular, and axillary nodes normal. No abnormal inguinal nodes palpated Neurologic: Grossly normal   Pelvic: External genitalia:  no lesions              Urethra:  normal appearing urethra with no masses, tenderness or lesions              Bartholins and Skenes: normal  Vagina: normal appearing vagina with normal color and discharge, no lesions              Cervix: {CHL AMB PHY EX CERVIX NORM [phone]::"no lesions"}               Bimanual Exam:  Uterus:  {CHL AMB PHY EX UTERUS NORM DEFAULT:[phone]::"normal size, contour, position, consistency, mobility, non-tender"}              Adnexa: {CHL AMB PHY EX ADNEXA NO MASS DEFAULT:[phone]::"no mass, fullness, tenderness"}               Rectovaginal: Confirms               Anus:  normal sphincter tone, no lesions  *** chaperoned for the exam.  A:  Well Woman with normal exam  P:

## 2022-10-23 DIAGNOSIS — F339 Major depressive disorder, recurrent, unspecified: Secondary | ICD-10-CM | POA: Diagnosis not present

## 2022-10-28 ENCOUNTER — Ambulatory Visit: Payer: BC Managed Care – PPO | Admitting: Obstetrics and Gynecology

## 2023-01-15 DIAGNOSIS — F339 Major depressive disorder, recurrent, unspecified: Secondary | ICD-10-CM | POA: Diagnosis not present

## 2023-04-02 DIAGNOSIS — F339 Major depressive disorder, recurrent, unspecified: Secondary | ICD-10-CM | POA: Diagnosis not present

## 2023-04-07 ENCOUNTER — Encounter: Payer: Self-pay | Admitting: Family Medicine

## 2023-04-07 ENCOUNTER — Ambulatory Visit (INDEPENDENT_AMBULATORY_CARE_PROVIDER_SITE_OTHER): Payer: BC Managed Care – PPO | Admitting: Family Medicine

## 2023-04-07 VITALS — BP 102/76 | HR 80 | Temp 97.6°F | Ht 68.0 in | Wt 200.0 lb

## 2023-04-07 DIAGNOSIS — E7800 Pure hypercholesterolemia, unspecified: Secondary | ICD-10-CM

## 2023-04-07 DIAGNOSIS — R7303 Prediabetes: Secondary | ICD-10-CM

## 2023-04-07 DIAGNOSIS — Z83719 Family history of colon polyps, unspecified: Secondary | ICD-10-CM | POA: Diagnosis not present

## 2023-04-07 DIAGNOSIS — Z0000 Encounter for general adult medical examination without abnormal findings: Secondary | ICD-10-CM

## 2023-04-07 DIAGNOSIS — Z0001 Encounter for general adult medical examination with abnormal findings: Secondary | ICD-10-CM

## 2023-04-07 LAB — COMPREHENSIVE METABOLIC PANEL
ALT: 12 U/L (ref 0–35)
AST: 17 U/L (ref 0–37)
Albumin: 4.2 g/dL (ref 3.5–5.2)
Alkaline Phosphatase: 47 U/L (ref 39–117)
BUN: 10 mg/dL (ref 6–23)
CO2: 32 mEq/L (ref 19–32)
Calcium: 8.9 mg/dL (ref 8.4–10.5)
Chloride: 99 mEq/L (ref 96–112)
Creatinine, Ser: 0.78 mg/dL (ref 0.40–1.20)
GFR: 95.43 mL/min (ref >60.00)
Glucose, Bld: 104 mg/dL — ABNORMAL HIGH (ref 70–99)
Potassium: 4.1 mEq/L (ref 3.5–5.1)
Sodium: 136 mEq/L (ref 135–145)
Total Bilirubin: 0.4 mg/dL (ref 0.2–1.2)
Total Protein: 7.3 g/dL (ref 6.0–8.3)

## 2023-04-07 LAB — TSH: TSH: 1 u[IU]/mL (ref 0.35–5.50)

## 2023-04-07 LAB — CBC WITH DIFFERENTIAL/PLATELET
Basophils Absolute: 0 10*3/uL (ref 0.0–0.1)
Basophils Relative: 0.4 % (ref 0.0–3.0)
Eosinophils Absolute: 0.1 10*3/uL (ref 0.0–0.7)
Eosinophils Relative: 2.1 % (ref 0.0–5.0)
HCT: 40.6 % (ref 36.0–46.0)
Hemoglobin: 13.3 g/dL (ref 12.0–15.0)
Lymphocytes Relative: 18.4 % (ref 12.0–46.0)
Lymphs Abs: 1.1 10*3/uL (ref 0.7–4.0)
MCHC: 32.7 g/dL (ref 30.0–36.0)
MCV: 92.5 fl (ref 78.0–100.0)
Monocytes Absolute: 0.5 10*3/uL (ref 0.1–1.0)
Monocytes Relative: 7.9 % (ref 3.0–12.0)
Neutro Abs: 4.4 10*3/uL (ref 1.4–7.7)
Neutrophils Relative %: 71.2 % (ref 43.0–77.0)
Platelets: 266 10*3/uL (ref 150.0–400.0)
RBC: 4.39 Mil/uL (ref 3.87–5.11)
RDW: 13.8 % (ref 11.5–15.5)
WBC: 6.2 10*3/uL (ref 4.0–10.5)

## 2023-04-07 LAB — HEMOGLOBIN A1C: Hgb A1c MFr Bld: 5.9 % (ref 4.6–6.5)

## 2023-04-07 LAB — T4, FREE: Free T4: 0.68 ng/dL (ref 0.60–1.60)

## 2023-04-07 NOTE — Progress Notes (Signed)
Complete physical exam  Patient: Katrina Phillips   DOB: 1981-02-28   42 y.o. Female  MRN: [id number]  Subjective:    Chief Complaint  Patient presents with   Annual Exam    Ate lunch  Would like to recheck for prediabetes. Sister had colonoscopy advanced adenoma so dr told her it is recommended for family members to start testing   She is new to the practice and here for a complete physical exam.   Sister with large precancerous polyp. Advised she should see GI.    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 Takes Armodafinil    Health Maintenance  Topic Date Due   Hepatitis C Screening  Never done   COVID-19 Vaccine (3 - 2023-24 season) 04/23/2023*   Flu Shot  05/13/2023   Pap Smear  10/21/2024   DTaP/Tdap/Td vaccine (3 - Td or Tdap) 04/11/2026   HIV Screening  Completed   HPV Vaccine  Aged Out  *Topic was postponed. The date shown is not the original due date.    Wears seatbelt always, uses sunscreen, smoke detectors in home and functioning, does not text while driving, feels safe in home environment.  Depression screening:    04/07/2023    1:47 PM 06/23/2021    9:54 AM 04/13/2018   10:00 AM  Depression screen PHQ 2/9  Decreased Interest 0 1 1  Down, Depressed, Hopeless 0 1 1  PHQ - 2 Score 0 2 2  Altered sleeping  1   Tired, decreased energy  3   Change in appetite  0   Feeling bad or failure about yourself   1   Trouble concentrating  1   Moving slowly or fidgety/restless  0   Suicidal thoughts  0   PHQ-9 Score  8    Anxiety Screening:     No data to display          Vision:Not within last year  and Dental: No current dental problems and Receives regular dental care  Patient Active Problem List   Diagnosis Date Noted   Pure hypercholesterolemia 10/21/2021   Second degree perineal laceration 09/02/2020   GDM, class A2 09/02/2020   Positive GBS test 09/02/2020   Normal spontaneous  vaginal delivery 09/01/2020   Attention deficit hyperactivity disorder (ADHD) 08/18/2018   Depression with anxiety 04/13/2018   Polycystic ovarian syndrome 02-13-83   Past Medical History:  Diagnosis Date   Abnormal Pap smear of cervix 03/15/2017   HR HPV: +Detected   ADHD    Allergy    SEASONAL   Anxiety    Asthma    AS A CHILD   Depression    Gestational diabetes    H/O concussion 1995   Past Surgical History:  Procedure Laterality Date   COLPOSCOPY  2006   LEEP     NO PAST SURGERIES     WISDOM TOOTH EXTRACTION     Social History   Tobacco Use   Smoking status: Never   Smokeless tobacco: Never  Vaping Use   Vaping Use: Never used  Substance Use Topics   Alcohol use: Yes    Alcohol/week: 1.0 - 4.0 standard drink of alcohol    Types: 1 - 4 Standard drinks or equivalent per week    Comment: social   Drug use: No      Patient Care Team: Avanell Shackleton, NP-C as PCP - General (Family Medicine)   Outpatient Medications Prior to Visit  Medication Sig   ARIPiprazole (ABILIFY) 5 MG tablet Take 5 mg by mouth daily.   Armodafinil 50 MG tablet Take 50-100 mg by mouth 2 (two) times daily.   FLUoxetine (PROZAC) 40 MG capsule Take 40 mg by mouth daily.   No facility-administered medications prior to visit.    Review of Systems  Constitutional:  Negative for chills and fever.  HENT:  Negative for congestion, ear pain, sinus pain and sore throat.   Eyes:  Negative for blurred vision, double vision and pain.  Respiratory:  Negative for cough, shortness of breath and wheezing.   Cardiovascular:  Negative for chest pain, palpitations and leg swelling.  Gastrointestinal:  Negative for abdominal pain, constipation, diarrhea, nausea and vomiting.  Genitourinary:  Negative for dysuria, frequency and urgency.  Musculoskeletal:  Negative for back pain, joint pain and myalgias.  Skin:  Negative for rash.  Neurological:  Negative for dizziness, tingling, focal weakness and  headaches.  Endo/Heme/Allergies:  Does not bruise/bleed easily.  Psychiatric/Behavioral:  Negative for depression and memory loss. The patient is not nervous/anxious.        Objective:    BP 102/76 (BP Location: Left Arm, Patient Position: Sitting, Cuff Size: Large)   Pulse 80   Temp 97.6 F (36.4 C) (Temporal)   Ht 5\' 8"  (1.727 m)   Wt 200 lb (90.7 kg)   SpO2 99%   BMI 30.41 kg/m  BP Readings from Last 3 Encounters:  04/07/23 102/76  10/21/21 116/72  08/15/21 116/74   Wt Readings from Last 3 Encounters:  04/07/23 200 lb (90.7 kg)  10/21/21 178 lb (80.7 kg)  06/23/21 173 lb 3.2 oz (78.6 kg)    Physical Exam Constitutional:      General: She is not in acute distress.    Appearance: She is not ill-appearing.  HENT:     Right Ear: Tympanic membrane, ear canal and external ear normal.     Left Ear: Tympanic membrane, ear canal and external ear normal.     Nose: Nose normal.     Mouth/Throat:     Mouth: Mucous membranes are moist.     Pharynx: Oropharynx is clear.  Eyes:     Extraocular Movements: Extraocular movements intact.     Conjunctiva/sclera: Conjunctivae normal.     Pupils: Pupils are equal, round, and reactive to light.  Neck:     Thyroid: No thyroid mass, thyromegaly or thyroid tenderness.  Cardiovascular:     Rate and Rhythm: Normal rate and regular rhythm.     Pulses: Normal pulses.     Heart sounds: Normal heart sounds.  Pulmonary:     Effort: Pulmonary effort is normal.     Breath sounds: Normal breath sounds.  Abdominal:     General: Bowel sounds are normal.     Palpations: Abdomen is soft.     Tenderness: There is no abdominal tenderness. There is no right CVA tenderness, left CVA tenderness, guarding or rebound.  Musculoskeletal:        General: Normal range of motion.     Cervical back: Normal range of motion and neck supple. No tenderness.     Right lower leg: No edema.     Left lower leg: No edema.  Lymphadenopathy:     Cervical: No  cervical adenopathy.  Skin:    General: Skin is warm and dry.     Findings: No lesion or rash.  Neurological:     General: No focal deficit present.  Mental Status: She is alert and oriented to person, place, and time.     Cranial Nerves: No cranial nerve deficit.     Sensory: No sensory deficit.     Motor: No weakness.     Gait: Gait normal.  Psychiatric:        Mood and Affect: Mood normal.        Behavior: Behavior normal.        Thought Content: Thought content normal.      Results for orders placed or performed in visit on 04/07/23  T4, free  Result Value Ref Range   Free T4 0.68 0.60 - 1.60 ng/dL  TSH  Result Value Ref Range   TSH 1.00 0.35 - 5.50 uIU/mL  Hemoglobin A1c  Result Value Ref Range   Hgb A1c MFr Bld 5.9 4.6 - 6.5 %  Comprehensive metabolic panel  Result Value Ref Range   Sodium 136 135 - 145 mEq/L   Potassium 4.1 3.5 - 5.1 mEq/L   Chloride 99 96 - 112 mEq/L   CO2 32 19 - 32 mEq/L   Glucose, Bld 104 (H) 70 - 99 mg/dL   BUN 10 6 - 23 mg/dL   Creatinine, Ser 8.29 0.40 - 1.20 mg/dL   Total Bilirubin 0.4 0.2 - 1.2 mg/dL   Alkaline Phosphatase 47 39 - 117 U/L   AST 17 0 - 37 U/L   ALT 12 0 - 35 U/L   Total Protein 7.3 6.0 - 8.3 g/dL   Albumin 4.2 3.5 - 5.2 g/dL   GFR 56.21 >30.86 mL/min   Calcium 8.9 8.4 - 10.5 mg/dL  CBC with Differential/Platelet  Result Value Ref Range   WBC 6.2 4.0 - 10.5 K/uL   RBC 4.39 3.87 - 5.11 Mil/uL   Hemoglobin 13.3 12.0 - 15.0 g/dL   HCT 57.8 46.9 - 62.9 %   MCV 92.5 78.0 - 100.0 fl   MCHC 32.7 30.0 - 36.0 g/dL   RDW 52.8 41.3 - 24.4 %   Platelets 266.0 150.0 - 400.0 K/uL   Neutrophils Relative % 71.2 43.0 - 77.0 %   Lymphocytes Relative 18.4 12.0 - 46.0 %   Monocytes Relative 7.9 3.0 - 12.0 %   Eosinophils Relative 2.1 0.0 - 5.0 %   Basophils Relative 0.4 0.0 - 3.0 %   Neutro Abs 4.4 1.4 - 7.7 K/uL   Lymphs Abs 1.1 0.7 - 4.0 K/uL   Monocytes Absolute 0.5 0.1 - 1.0 K/uL   Eosinophils Absolute 0.1 0.0 - 0.7  K/uL   Basophils Absolute 0.0 0.0 - 0.1 K/uL      Assessment & Plan:    Routine Health Maintenance and Physical Exam  Problem List Items Addressed This Visit       Other   Pure hypercholesterolemia   Other Visit Diagnoses     Encounter for general adult medical examination with abnormal findings    -  Primary   Prediabetes       Relevant Orders   CBC with Differential/Platelet (Completed)   Comprehensive metabolic panel (Completed)   Hemoglobin A1c (Completed)   TSH (Completed)   T4, free (Completed)   Family history of colonic polyps       Relevant Orders   Ambulatory referral to Gastroenterology      Preventive health care reviewed.  Counseling on healthy lifestyle including diet and exercise.  Recommend regular dental and eye exams.  Immunizations reviewed.  Discussed safety. Referral to GI due to family history.  Return in about 1 year (around 04/06/2024).     Hetty Blend, NP-C

## 2023-04-07 NOTE — Patient Instructions (Signed)
Prediabetes Eating Plan Prediabetes is a condition that causes blood sugar (glucose) levels to be higher than normal. This increases the risk for developing type 2 diabetes (type 2 diabetes mellitus). Working with a health care provider or nutrition specialist (dietitian) to make diet and lifestyle changes can help prevent the onset of diabetes. These changes may help you: Control your blood glucose levels. Improve your cholesterol levels. Manage your blood pressure. What are tips for following this plan? Reading food labels Read food labels to check the amount of fat, salt (sodium), and sugar in prepackaged foods. Avoid foods that have: Saturated fats. Trans fats. Added sugars. Avoid foods that have more than 300 milligrams (mg) of sodium per serving. Limit your sodium intake to less than 2,300 mg each day. Shopping Avoid buying pre-made and processed foods. Avoid buying drinks with added sugar. Cooking Cook with olive oil. Do not use butter, lard, or ghee. Bake, broil, grill, steam, or boil foods. Avoid frying. Meal planning  Work with your dietitian to create an eating plan that is right for you. This may include tracking how many calories you take in each day. Use a food diary, notebook, or mobile application to track what you eat at each meal. Consider following a Mediterranean diet. This includes: Eating several servings of fresh fruits and vegetables each day. Eating fish at least twice a week. Eating one serving each day of whole grains, beans, nuts, and seeds. Using olive oil instead of other fats. Limiting alcohol. Limiting red meat. Using nonfat or low-fat dairy products. Consider following a plant-based diet. This includes dietary choices that focus on eating mostly vegetables and fruit, grains, beans, nuts, and seeds. If you have high blood pressure, you may need to limit your sodium intake or follow a diet such as the DASH (Dietary Approaches to Stop Hypertension) eating  plan. The DASH diet aims to lower high blood pressure. Lifestyle Set weight loss goals with help from your health care team. It is recommended that most people with prediabetes lose 7% of their body weight. Exercise for at least 30 minutes 5 or more days a week. Attend a support group or seek support from a mental health counselor. Take over-the-counter and prescription medicines only as told by your health care provider. What foods are recommended? Fruits Berries. Bananas. Apples. Oranges. Grapes. Papaya. Mango. Pomegranate. Kiwi. Grapefruit. Cherries. Vegetables Lettuce. Spinach. Peas. Beets. Cauliflower. Cabbage. Broccoli. Carrots. Tomatoes. Squash. Eggplant. Herbs. Peppers. Onions. Cucumbers. Brussels sprouts. Grains Whole grains, such as whole-wheat or whole-grain breads, crackers, cereals, and pasta. Unsweetened oatmeal. Bulgur. Barley. Quinoa. Brown rice. Corn or whole-wheat flour tortillas or taco shells. Meats and other proteins Seafood. Poultry without skin. Lean cuts of pork and beef. Tofu. Eggs. Nuts. Beans. Dairy Low-fat or fat-free dairy products, such as yogurt, cottage cheese, and cheese. Beverages Water. Tea. Coffee. Sugar-free or diet soda. Seltzer water. Low-fat or nonfat milk. Milk alternatives, such as soy or almond milk. Fats and oils Olive oil. Canola oil. Sunflower oil. Grapeseed oil. Avocado. Walnuts. Sweets and desserts Sugar-free or low-fat pudding. Sugar-free or low-fat ice cream and other frozen treats. Seasonings and condiments Herbs. Sodium-free spices. Mustard. Relish. Low-salt, low-sugar ketchup. Low-salt, low-sugar barbecue sauce. Low-fat or fat-free mayonnaise. The items listed above may not be a complete list of recommended foods and beverages. Contact a dietitian for more information. What foods are not recommended? Fruits Fruits canned with syrup. Vegetables Canned vegetables. Frozen vegetables with butter or cream sauce. Grains Refined white  flour and flour   products, such as bread, pasta, snack foods, and cereals. Meats and other proteins Fatty cuts of meat. Poultry with skin. Breaded or fried meat. Processed meats. Dairy Full-fat yogurt, cheese, or milk. Beverages Sweetened drinks, such as iced tea and soda. Fats and oils Butter. Lard. Ghee. Sweets and desserts Baked goods, such as cake, cupcakes, pastries, cookies, and cheesecake. Seasonings and condiments Spice mixes with added salt. Ketchup. Barbecue sauce. Mayonnaise. The items listed above may not be a complete list of foods and beverages that are not recommended. Contact a dietitian for more information. Where to find more information American Diabetes Association: www.diabetes.org Summary You may need to make diet and lifestyle changes to help prevent the onset of diabetes. These changes can help you control blood sugar, improve cholesterol levels, and manage blood pressure. Set weight loss goals with help from your health care team. It is recommended that most people with prediabetes lose 7% of their body weight. Consider following a Mediterranean diet. This includes eating plenty of fresh fruits and vegetables, whole grains, beans, nuts, seeds, fish, and low-fat dairy, and using olive oil instead of other fats. This information is not intended to replace advice given to you by your health care provider. Make sure you discuss any questions you have with your health care provider. Document Revised: 12/28/2019 Document Reviewed: 12/28/2019 Elsevier Patient Education  2024 [employer]  

## 2023-05-04 ENCOUNTER — Encounter: Payer: Self-pay | Admitting: Internal Medicine

## 2023-05-04 ENCOUNTER — Ambulatory Visit: Payer: No Typology Code available for payment source | Admitting: Internal Medicine

## 2023-05-04 VITALS — BP 118/78 | HR 67 | Temp 98.6°F | Ht 68.0 in | Wt 200.0 lb

## 2023-05-04 DIAGNOSIS — R110 Nausea: Secondary | ICD-10-CM

## 2023-05-04 DIAGNOSIS — R1031 Right lower quadrant pain: Secondary | ICD-10-CM | POA: Diagnosis not present

## 2023-05-04 LAB — COMPREHENSIVE METABOLIC PANEL
ALT: 8 U/L (ref 0–35)
AST: 11 U/L (ref 0–37)
Albumin: 4.2 g/dL (ref 3.5–5.2)
Alkaline Phosphatase: 52 U/L (ref 39–117)
BUN: 10 mg/dL (ref 6–23)
CO2: 29 mEq/L (ref 19–32)
Calcium: 9.4 mg/dL (ref 8.4–10.5)
Chloride: 100 mEq/L (ref 96–112)
Creatinine, Ser: 0.76 mg/dL (ref 0.40–1.20)
GFR: 98.4 mL/min (ref >60.00)
Glucose, Bld: 130 mg/dL — ABNORMAL HIGH (ref 70–99)
Potassium: 4 mEq/L (ref 3.5–5.1)
Sodium: 135 mEq/L (ref 135–145)
Total Bilirubin: 0.2 mg/dL (ref 0.2–1.2)
Total Protein: 7.4 g/dL (ref 6.0–8.3)

## 2023-05-04 LAB — URINALYSIS
Bilirubin Urine: NEGATIVE
Ketones, ur: NEGATIVE
Leukocytes,Ua: NEGATIVE
Nitrite: NEGATIVE
Specific Gravity, Urine: 1.01 (ref 1.000–1.030)
Total Protein, Urine: NEGATIVE
Urine Glucose: NEGATIVE
Urobilinogen, UA: 0.2 (ref 0.0–1.0)
pH: 6.5 (ref 5.0–8.0)

## 2023-05-04 LAB — HCG, QUANTITATIVE, PREGNANCY: Quantitative HCG: <0.6 m[IU]/mL

## 2023-05-04 LAB — CBC WITH DIFFERENTIAL/PLATELET
Basophils Absolute: 0.1 10*3/uL (ref 0.0–0.1)
Basophils Relative: 0.7 % (ref 0.0–3.0)
Eosinophils Absolute: 0.2 10*3/uL (ref 0.0–0.7)
Eosinophils Relative: 2.5 % (ref 0.0–5.0)
HCT: 42 % (ref 36.0–46.0)
Hemoglobin: 13.7 g/dL (ref 12.0–15.0)
Lymphocytes Relative: 21.6 % (ref 12.0–46.0)
Lymphs Abs: 1.8 10*3/uL (ref 0.7–4.0)
MCHC: 32.7 g/dL (ref 30.0–36.0)
MCV: 93.5 fl (ref 78.0–100.0)
Monocytes Absolute: 0.5 10*3/uL (ref 0.1–1.0)
Monocytes Relative: 5.6 % (ref 3.0–12.0)
Neutro Abs: 5.8 10*3/uL (ref 1.4–7.7)
Neutrophils Relative %: 69.6 % (ref 43.0–77.0)
Platelets: 314 10*3/uL (ref 150.0–400.0)
RBC: 4.5 Mil/uL (ref 3.87–5.11)
RDW: 13.8 % (ref 11.5–15.5)
WBC: 8.3 10*3/uL (ref 4.0–10.5)

## 2023-05-04 LAB — SEDIMENTATION RATE: Sed Rate: 31 mm/hr — ABNORMAL HIGH (ref 0–20)

## 2023-05-04 MED ORDER — ONDANSETRON HCL 4 MG PO TABS: 4.0000 mg | ORAL_TABLET | Freq: Three times a day (TID) | ORAL | 0 refills | Status: DC | PRN

## 2023-05-04 NOTE — Assessment & Plan Note (Signed)
Abd CT Labs w/CBC, CMET, UA Zofran po prn

## 2023-05-04 NOTE — Assessment & Plan Note (Signed)
Abd CT - r/o appendicitis Labs w/CBC, CMET, UA To ER if worse

## 2023-05-04 NOTE — Progress Notes (Signed)
Subjective:  Patient ID: Katrina Phillips, female    DOB: December 20, 1980  Age: 42 y.o. MRN: [id number]  CC: Nausea (Abdominal pains, lost of appetite)   HPI Katrina Phillips presents for nausea and vomiting starting 4 weeks ago. , some diarrhea. Her son was sick at the time. Nausea is better. Abd pain started 3 d ago - lower abd pain and LBP. No dysuria.  LMP 3 d ago. Pain is 4-6/10.  She is a therapist. No appetite. Pt was up last night due to pain.  Outpatient Medications Prior to Visit  Medication Sig Dispense Refill   ARIPiprazole (ABILIFY) 5 MG tablet Take 5 mg by mouth daily.     Armodafinil 50 MG tablet Take 50-100 mg by mouth 2 (two) times daily.     FLUoxetine (PROZAC) 40 MG capsule Take 40 mg by mouth daily.     No facility-administered medications prior to visit.    ROS: Review of Systems  Constitutional:  Negative for activity change, appetite change, chills, fatigue and unexpected weight change.  HENT:  Negative for congestion, mouth sores and sinus pressure.   Eyes:  Negative for visual disturbance.  Respiratory:  Negative for cough and chest tightness.   Gastrointestinal:  Positive for abdominal pain, nausea and vomiting. Negative for blood in stool and diarrhea.  Genitourinary:  Negative for difficulty urinating, frequency, urgency and vaginal pain.  Musculoskeletal:  Positive for back pain. Negative for gait problem.  Skin:  Negative for pallor and rash.  Neurological:  Negative for dizziness, tremors, weakness, numbness and headaches.  Psychiatric/Behavioral:  Negative for confusion and sleep disturbance. The patient is not nervous/anxious.     Objective:  BP 118/78 (BP Location: Right Arm, Patient Position: Sitting, Cuff Size: Large)   Pulse 67   Temp 98.6 F (37 C) (Oral)   Ht 5\' 8"  (1.727 m)   Wt 200 lb (90.7 kg)   LMP 04/30/2023 (Approximate)   SpO2 97%   Breastfeeding No   BMI 30.41 kg/m   BP Readings from Last 3 Encounters:   05/04/23 118/78  04/07/23 102/76  10/21/21 116/72    Wt Readings from Last 3 Encounters:  05/04/23 200 lb (90.7 kg)  04/07/23 200 lb (90.7 kg)  10/21/21 178 lb (80.7 kg)    Physical Exam Constitutional:      General: She is not in acute distress.    Appearance: She is well-developed. She is obese. She is not ill-appearing or toxic-appearing.  HENT:     Head: Normocephalic.     Right Ear: External ear normal.     Left Ear: External ear normal.     Nose: Nose normal.  Eyes:     General:        Right eye: No discharge.        Left eye: No discharge.     Conjunctiva/sclera: Conjunctivae normal.     Pupils: Pupils are equal, round, and reactive to light.  Neck:     Thyroid: No thyromegaly.     Vascular: No JVD.     Trachea: No tracheal deviation.  Cardiovascular:     Rate and Rhythm: Normal rate and regular rhythm.     Heart sounds: Normal heart sounds.  Pulmonary:     Effort: No respiratory distress.     Breath sounds: No stridor. No wheezing.  Abdominal:     General: Bowel sounds are normal. There is no distension.     Palpations: Abdomen is soft. There is no mass.  Tenderness: There is no abdominal tenderness. There is no guarding or rebound.  Musculoskeletal:        General: No tenderness.     Cervical back: Normal range of motion and neck supple. No rigidity.  Lymphadenopathy:     Cervical: No cervical adenopathy.  Skin:    Findings: No erythema or rash.  Neurological:     Cranial Nerves: No cranial nerve deficit.     Motor: No abnormal muscle tone.     Coordination: Coordination normal.     Deep Tendon Reflexes: Reflexes normal.  Psychiatric:        Behavior: Behavior normal.        Thought Content: Thought content normal.        Judgment: Judgment normal.   Looks tired RLQw/pain  Lab Results  Component Value Date   WBC 6.2 04/07/2023   HGB 13.3 04/07/2023   HCT 40.6 04/07/2023   PLT 266.0 04/07/2023   GLUCOSE 104 (H) 04/07/2023   ALT 12  04/07/2023   AST 17 04/07/2023   NA 136 04/07/2023   K 4.1 04/07/2023   CL 99 04/07/2023   CREATININE 0.78 04/07/2023   BUN 10 04/07/2023   CO2 32 04/07/2023   TSH 1.00 04/07/2023   INR 1.0 09/07/2019   HGBA1C 5.9 04/07/2023    No results found.  Assessment & Plan:   Problem List Items Addressed This Visit     Right lower quadrant abdominal pain - Primary    Abd CT - r/o appendicitis Labs w/CBC, CMET, UA To ER if worse      Relevant Orders   CBC with Differential/Platelet   Comprehensive metabolic panel   CT ABDOMEN PELVIS W CONTRAST   hCG, quantitative, pregnancy   Sedimentation rate   Urinalysis   Nausea    Abd CT Labs w/CBC, CMET, UA Zofran po prn      Relevant Orders   CT ABDOMEN PELVIS W CONTRAST   hCG, quantitative, pregnancy   Sedimentation rate      Meds ordered this encounter  Medications   DISCONTD: ondansetron (ZOFRAN) 4 MG tablet    Sig: Take 1 tablet (4 mg total) by mouth every 8 (eight) hours as needed for nausea or vomiting.    Dispense:  20 tablet    Refill:  0   ondansetron (ZOFRAN) 4 MG tablet    Sig: Take 1 tablet (4 mg total) by mouth every 8 (eight) hours as needed for nausea or vomiting.    Dispense:  20 tablet    Refill:  0      Follow-up: Return in about 1 week (around 05/11/2023) for a follow-up visit.  Sonda Primes, MD

## 2023-05-05 ENCOUNTER — Encounter: Payer: Self-pay | Admitting: Internal Medicine

## 2023-05-06 ENCOUNTER — Other Ambulatory Visit: Payer: Self-pay | Admitting: Internal Medicine

## 2023-05-06 ENCOUNTER — Ambulatory Visit
Admission: RE | Admit: 2023-05-06 | Discharge: 2023-05-06 | Disposition: A | Discharge status: Home / Self Care | Payer: BC Managed Care – PPO | Source: Ambulatory Visit | Attending: Internal Medicine | Admitting: Internal Medicine

## 2023-05-06 DIAGNOSIS — R1031 Right lower quadrant pain: Secondary | ICD-10-CM | POA: Diagnosis not present

## 2023-05-06 DIAGNOSIS — R110 Nausea: Secondary | ICD-10-CM

## 2023-05-06 DIAGNOSIS — K8020 Calculus of gallbladder without cholecystitis without obstruction: Secondary | ICD-10-CM | POA: Diagnosis not present

## 2023-05-06 DIAGNOSIS — R112 Nausea with vomiting, unspecified: Secondary | ICD-10-CM | POA: Diagnosis not present

## 2023-05-06 MED ORDER — IOPAMIDOL (ISOVUE-300) INJECTION 61%
100.0000 mL | Freq: Once | INTRAVENOUS | Status: AC | PRN
  Administered 2023-05-06: 100 mL via INTRAVENOUS

## 2023-05-06 MED ORDER — PANTOPRAZOLE SODIUM 40 MG PO TBEC: 40.0000 mg | DELAYED_RELEASE_TABLET | Freq: Every day | ORAL | 3 refills | Status: DC

## 2023-05-06 MED ORDER — AMOXICILLIN-POT CLAVULANATE 875-125 MG PO TABS: 1.0000 | ORAL_TABLET | Freq: Two times a day (BID) | ORAL | 0 refills | Status: DC

## 2023-05-11 ENCOUNTER — Ambulatory Visit: Payer: BC Managed Care – PPO | Admitting: Internal Medicine

## 2023-05-11 ENCOUNTER — Encounter: Payer: Self-pay | Admitting: Internal Medicine

## 2023-05-11 VITALS — BP 104/70 | HR 78 | Ht 68.0 in | Wt 205.0 lb

## 2023-05-11 DIAGNOSIS — R1030 Lower abdominal pain, unspecified: Secondary | ICD-10-CM | POA: Diagnosis not present

## 2023-05-11 DIAGNOSIS — K8020 Calculus of gallbladder without cholecystitis without obstruction: Secondary | ICD-10-CM | POA: Diagnosis not present

## 2023-05-11 DIAGNOSIS — R110 Nausea: Secondary | ICD-10-CM | POA: Diagnosis not present

## 2023-05-11 DIAGNOSIS — R109 Unspecified abdominal pain: Secondary | ICD-10-CM | POA: Insufficient documentation

## 2023-05-11 MED ORDER — OMEPRAZOLE 40 MG PO CPDR: 40.0000 mg | DELAYED_RELEASE_CAPSULE | Freq: Every day | ORAL | 3 refills | Status: DC

## 2023-05-11 NOTE — Assessment & Plan Note (Signed)
Start Omeprazole Abd CT with gallstones - doubt symptomatic GI ref  GYN f/u appt

## 2023-05-11 NOTE — Assessment & Plan Note (Signed)
GS on abd CT Doubt they are responsible for current abd sx's Empiric Augmentin x10 d GI consult

## 2023-05-11 NOTE — Assessment & Plan Note (Addendum)
?  etiology Ongoing lower  abdominal pain and LBP and nausea Pt did not get Protonix - it was not covered Pt started Augmentin last night Start Omeprazole F/u w/Vickie in 2 wks GYN appt - Jesicca will call them GI consult

## 2023-05-11 NOTE — Progress Notes (Signed)
Subjective:  Patient ID: Katrina Phillips, female    DOB: 22-Mar-1981  Age: 42 y.o. MRN: [id number]  CC: Follow-up (1 week f/u)   HPI Katrina Phillips presents for lower  abdominal pain and LBP and nausea Pt did not get Protonix - it was not covered? Pt started Augmentin last night  Outpatient Medications Prior to Visit  Medication Sig Dispense Refill   amoxicillin-clavulanate (AUGMENTIN) 875-125 MG tablet Take 1 tablet by mouth 2 (two) times daily. 20 tablet 0   ARIPiprazole (ABILIFY) 5 MG tablet Take 5 mg by mouth daily.     Armodafinil 50 MG tablet Take 50-100 mg by mouth 2 (two) times daily.     FLUoxetine (PROZAC) 40 MG capsule Take 40 mg by mouth daily.     ondansetron (ZOFRAN) 4 MG tablet Take 1 tablet (4 mg total) by mouth every 8 (eight) hours as needed for nausea or vomiting. 20 tablet 0   pantoprazole (PROTONIX) 40 MG tablet Take 1 tablet (40 mg total) by mouth daily. 30 tablet 3   No facility-administered medications prior to visit.    ROS: Review of Systems  Constitutional:  Positive for fatigue. Negative for activity change, appetite change, chills and unexpected weight change.  HENT:  Negative for congestion, mouth sores and sinus pressure.   Eyes:  Negative for visual disturbance.  Respiratory:  Negative for cough and chest tightness.   Gastrointestinal:  Positive for abdominal pain and nausea. Negative for vomiting.  Genitourinary:  Negative for difficulty urinating, frequency and vaginal pain.  Musculoskeletal:  Negative for back pain and gait problem.  Skin:  Negative for pallor and rash.  Neurological:  Negative for dizziness, tremors, weakness, numbness and headaches.  Psychiatric/Behavioral:  Negative for confusion and sleep disturbance.     Objective:  BP 104/70 (BP Location: Right Arm, Patient Position: Sitting, Cuff Size: Normal)   Pulse 78   Ht 5\' 8"  (1.727 m)   Wt 205 lb (93 kg)   LMP 04/30/2023 (Approximate)   SpO2 96%   BMI  31.17 kg/m   BP Readings from Last 3 Encounters:  05/11/23 104/70  05/04/23 118/78  04/07/23 102/76    Wt Readings from Last 3 Encounters:  05/11/23 205 lb (93 kg)  05/04/23 200 lb (90.7 kg)  04/07/23 200 lb (90.7 kg)    Physical Exam Constitutional:      General: She is not in acute distress.    Appearance: She is well-developed. She is obese.  HENT:     Head: Normocephalic.     Right Ear: External ear normal.     Left Ear: External ear normal.     Nose: Nose normal.  Eyes:     General:        Right eye: No discharge.        Left eye: No discharge.     Conjunctiva/sclera: Conjunctivae normal.     Pupils: Pupils are equal, round, and reactive to light.  Neck:     Thyroid: No thyromegaly.     Vascular: No JVD.     Trachea: No tracheal deviation.  Cardiovascular:     Rate and Rhythm: Normal rate and regular rhythm.     Heart sounds: Normal heart sounds.  Pulmonary:     Effort: No respiratory distress.     Breath sounds: No stridor. No wheezing.  Abdominal:     General: Bowel sounds are normal. There is no distension.     Palpations: Abdomen is soft. There is  no mass.     Tenderness: There is no abdominal tenderness. There is no guarding or rebound.  Musculoskeletal:        General: No tenderness.     Cervical back: Normal range of motion and neck supple. No rigidity.  Lymphadenopathy:     Cervical: No cervical adenopathy.  Skin:    Findings: No erythema or rash.  Neurological:     Cranial Nerves: No cranial nerve deficit.     Motor: No abnormal muscle tone.     Coordination: Coordination normal.     Deep Tendon Reflexes: Reflexes normal.  Psychiatric:        Behavior: Behavior normal.        Thought Content: Thought content normal.        Judgment: Judgment normal.     Lab Results  Component Value Date   WBC 8.3 05/04/2023   HGB 13.7 05/04/2023   HCT 42.0 05/04/2023   PLT 314.0 05/04/2023   GLUCOSE 130 (H) 05/04/2023   ALT 8 05/04/2023   AST 11  05/04/2023   NA 135 05/04/2023   K 4.0 05/04/2023   CL 100 05/04/2023   CREATININE 0.76 05/04/2023   BUN 10 05/04/2023   CO2 29 05/04/2023   TSH 1.00 04/07/2023   INR 1.0 09/07/2019   HGBA1C 5.9 04/07/2023    CT ABDOMEN PELVIS W CONTRAST  Result Date: 05/06/2023 CLINICAL DATA:  Right lower quadrant pain. Nausea and vomiting. Possible appendicitis. EXAM: CT ABDOMEN AND PELVIS WITH CONTRAST TECHNIQUE: Multidetector CT imaging of the abdomen and pelvis was performed using the standard protocol following bolus administration of intravenous contrast. RADIATION DOSE REDUCTION: This exam was performed according to the departmental dose-optimization program which includes automated exposure control, adjustment of the mA and/or kV according to patient size and/or use of iterative reconstruction technique. CONTRAST:  ISOVUE-300 IOPAMIDOL (ISOVUE-300) INJECTION 61% COMPARISON:  None Available. FINDINGS: Lower Chest: No acute findings. Hepatobiliary: No suspicious hepatic masses identified. Gallstones are seen, however there is no evidence of cholecystitis or biliary dilatation. Pancreas:  No mass or inflammatory changes. Spleen: Within normal limits in size and appearance. Adrenals/Urinary Tract: No suspicious masses identified. No evidence of ureteral calculi or hydronephrosis. Stomach/Bowel: No evidence of obstruction, inflammatory process or abnormal fluid collections. Normal appendix visualized. Vascular/Lymphatic: No pathologically enlarged lymph nodes. No acute vascular findings. Reproductive:  No mass or other significant abnormality. Other:  None. Musculoskeletal:  No suspicious bone lesions identified. IMPRESSION: No evidence of appendicitis or other acute findings. Cholelithiasis. No radiographic evidence of cholecystitis. Electronically Signed   By: Danae Orleans M.D.   On: 05/06/2023 13:20    Assessment & Plan:   Problem List Items Addressed This Visit     Nausea    Start Omeprazole Abd  CT with gallstones - doubt symptomatic GI ref  GYN f/u appt      Abdominal pain - Primary    ?etiology Ongoing lower  abdominal pain and LBP and nausea Pt did not get Protonix - it was not covered Pt started Augmentin last night Start Omeprazole F/u w/Katrina Phillips in 2 wks GYN appt - Jali will call them GI consult       Relevant Orders   Ambulatory referral to Gastroenterology   Gallstones    GS on abd CT Doubt they are responsible for current abd sx's Empiric Augmentin x10 d GI consult         Meds ordered this encounter  Medications   omeprazole (PRILOSEC) 40 MG capsule  Sig: Take 1 capsule (40 mg total) by mouth daily.    Dispense:  30 capsule    Refill:  3      Follow-up: Return in about 2 weeks (around 05/25/2023) for f/u with PCP.  Sonda Primes, MD

## 2023-05-12 ENCOUNTER — Encounter (INDEPENDENT_AMBULATORY_CARE_PROVIDER_SITE_OTHER): Payer: Self-pay

## 2023-05-12 ENCOUNTER — Ambulatory Visit: Payer: No Typology Code available for payment source | Admitting: Emergency Medicine

## 2023-05-13 DIAGNOSIS — L821 Other seborrheic keratosis: Secondary | ICD-10-CM | POA: Diagnosis not present

## 2023-05-13 DIAGNOSIS — L720 Epidermal cyst: Secondary | ICD-10-CM | POA: Diagnosis not present

## 2023-05-19 ENCOUNTER — Other Ambulatory Visit (HOSPITAL_COMMUNITY): Payer: Self-pay

## 2023-05-25 ENCOUNTER — Ambulatory Visit: Payer: BC Managed Care – PPO | Admitting: Family Medicine

## 2023-06-10 ENCOUNTER — Other Ambulatory Visit (HOSPITAL_BASED_OUTPATIENT_CLINIC_OR_DEPARTMENT_OTHER): Payer: Self-pay

## 2023-06-10 MED ORDER — ARMODAFINIL 50 MG PO TABS
50.0000 mg | ORAL_TABLET | Freq: Every morning | ORAL | 0 refills | Status: DC
  Filled 2023-06-10: qty 60, 30d supply, fill #0

## 2023-06-11 ENCOUNTER — Other Ambulatory Visit (HOSPITAL_BASED_OUTPATIENT_CLINIC_OR_DEPARTMENT_OTHER): Payer: Self-pay

## 2023-06-25 ENCOUNTER — Other Ambulatory Visit (HOSPITAL_BASED_OUTPATIENT_CLINIC_OR_DEPARTMENT_OTHER): Payer: Self-pay

## 2023-06-25 DIAGNOSIS — F339 Major depressive disorder, recurrent, unspecified: Secondary | ICD-10-CM | POA: Diagnosis not present

## 2023-06-25 MED ORDER — ARMODAFINIL 50 MG PO TABS
50.0000 mg | ORAL_TABLET | Freq: Every morning | ORAL | 2 refills | Status: DC
  Filled 2023-06-25 – 2023-07-13 (×2): qty 60, 30d supply, fill #0
  Filled 2023-08-16: qty 60, 30d supply, fill #1

## 2023-06-25 MED ORDER — FLUOXETINE HCL 40 MG PO CAPS
40.0000 mg | ORAL_CAPSULE | Freq: Every day | ORAL | 0 refills | Status: DC
  Filled 2023-06-25 – 2023-08-18 (×4): qty 90, 90d supply, fill #0

## 2023-06-25 MED ORDER — ARIPIPRAZOLE 2 MG PO TABS
2.0000 mg | ORAL_TABLET | Freq: Every evening | ORAL | 0 refills | Status: DC
  Filled 2023-06-25: qty 90, 90d supply, fill #0

## 2023-07-13 ENCOUNTER — Other Ambulatory Visit (HOSPITAL_BASED_OUTPATIENT_CLINIC_OR_DEPARTMENT_OTHER): Payer: Self-pay

## 2023-07-13 ENCOUNTER — Other Ambulatory Visit: Payer: Self-pay

## 2023-07-14 ENCOUNTER — Other Ambulatory Visit (HOSPITAL_BASED_OUTPATIENT_CLINIC_OR_DEPARTMENT_OTHER): Payer: Self-pay

## 2023-07-14 MED ORDER — COMIRNATY 30 MCG/0.3ML IM SUSY
0.3000 mL | PREFILLED_SYRINGE | Freq: Once | INTRAMUSCULAR | 0 refills | Status: AC
  Filled 2023-07-14: qty 0.3, 1d supply, fill #0

## 2023-07-14 MED ORDER — INFLUENZA VIRUS VACC SPLIT PF (FLUZONE) 0.5 ML IM SUSY
0.5000 mL | PREFILLED_SYRINGE | Freq: Once | INTRAMUSCULAR | 0 refills | Status: AC
  Filled 2023-07-14: qty 0.5, 1d supply, fill #0

## 2023-08-16 ENCOUNTER — Other Ambulatory Visit (HOSPITAL_BASED_OUTPATIENT_CLINIC_OR_DEPARTMENT_OTHER): Payer: Self-pay

## 2023-08-17 ENCOUNTER — Other Ambulatory Visit: Payer: Self-pay

## 2023-08-18 ENCOUNTER — Other Ambulatory Visit (HOSPITAL_BASED_OUTPATIENT_CLINIC_OR_DEPARTMENT_OTHER): Payer: Self-pay

## 2023-08-26 ENCOUNTER — Ambulatory Visit: Payer: BC Managed Care – PPO | Admitting: Radiology

## 2023-08-26 VITALS — BP 112/66

## 2023-08-26 DIAGNOSIS — N8110 Cystocele, unspecified: Secondary | ICD-10-CM

## 2023-08-26 NOTE — Progress Notes (Signed)
      Subjective: Katrina Phillips is a 42 y.o. female who complains of a bump in her vaginal her husband found 2.71months ago. Non painful, no enlargement since discovery. No bleeding or discharge.   Review of Systems  All other systems reviewed and are negative.   Past Medical History:  Diagnosis Date   Abnormal Pap smear of cervix 03/15/2017   HR HPV: +Detected   ADHD    Allergy    SEASONAL   Anxiety    Asthma    AS A CHILD   Depression    Gestational diabetes    H/O concussion 1995      Objective:  Today's Vitals   08/26/23 0947  BP: 112/66   There is no height or weight on file to calculate BMI.   Physical Exam Vitals and nursing note reviewed. Exam conducted with a chaperone present.  Constitutional:      Appearance: Normal appearance.  Pulmonary:     Effort: Pulmonary effort is normal.  Genitourinary:    General: Normal vulva.     Vagina: Prolapsed vaginal walls present.     Uterus: Normal.      Adnexa: Right adnexa normal and left adnexa normal.     Comments: Left vaginal wall stage 1 prolapse. No cytsocele or rectocele noted. Neurological:     Mental Status: She is alert.  Psychiatric:        Mood and Affect: Mood normal.        Thought Content: Thought content normal.        Judgment: Judgment normal.   Katrina Phillips, CMA present for exam  Assessment:/Plan:   1. Prolapse of vaginal wall Reassured Continue to monitor.    Katrina Jemison B, NP 9:55 AM

## 2023-08-27 ENCOUNTER — Other Ambulatory Visit (HOSPITAL_BASED_OUTPATIENT_CLINIC_OR_DEPARTMENT_OTHER): Payer: Self-pay

## 2023-08-27 DIAGNOSIS — F339 Major depressive disorder, recurrent, unspecified: Secondary | ICD-10-CM | POA: Diagnosis not present

## 2023-08-27 MED ORDER — LISDEXAMFETAMINE DIMESYLATE 30 MG PO CAPS
30.0000 mg | ORAL_CAPSULE | Freq: Every morning | ORAL | 0 refills | Status: DC
  Filled 2023-08-27: qty 30, 30d supply, fill #0

## 2023-09-24 ENCOUNTER — Other Ambulatory Visit (HOSPITAL_BASED_OUTPATIENT_CLINIC_OR_DEPARTMENT_OTHER): Payer: Self-pay

## 2023-09-24 DIAGNOSIS — F339 Major depressive disorder, recurrent, unspecified: Secondary | ICD-10-CM | POA: Diagnosis not present

## 2023-09-24 MED ORDER — LISDEXAMFETAMINE DIMESYLATE 20 MG PO CAPS
20.0000 mg | ORAL_CAPSULE | Freq: Every morning | ORAL | 0 refills | Status: DC
  Filled 2023-09-24: qty 30, 30d supply, fill #0

## 2023-10-12 ENCOUNTER — Ambulatory Visit: Payer: BC Managed Care – PPO | Admitting: Radiology

## 2023-10-22 ENCOUNTER — Ambulatory Visit: Payer: BC Managed Care – PPO | Admitting: Radiology

## 2023-10-22 ENCOUNTER — Other Ambulatory Visit (HOSPITAL_BASED_OUTPATIENT_CLINIC_OR_DEPARTMENT_OTHER): Payer: Self-pay

## 2023-10-22 DIAGNOSIS — F339 Major depressive disorder, recurrent, unspecified: Secondary | ICD-10-CM | POA: Diagnosis not present

## 2023-10-22 MED ORDER — FLUOXETINE HCL 40 MG PO CAPS
40.0000 mg | ORAL_CAPSULE | Freq: Every day | ORAL | 0 refills | Status: DC
  Filled 2023-10-22: qty 90, 90d supply, fill #0

## 2023-10-22 MED ORDER — LISDEXAMFETAMINE DIMESYLATE 20 MG PO CAPS
20.0000 mg | ORAL_CAPSULE | Freq: Every morning | ORAL | 0 refills | Status: DC
  Filled 2023-10-22: qty 30, 30d supply, fill #0

## 2023-11-19 ENCOUNTER — Other Ambulatory Visit (HOSPITAL_BASED_OUTPATIENT_CLINIC_OR_DEPARTMENT_OTHER): Payer: Self-pay

## 2023-11-19 DIAGNOSIS — F339 Major depressive disorder, recurrent, unspecified: Secondary | ICD-10-CM | POA: Diagnosis not present

## 2023-11-19 MED ORDER — FLUOXETINE HCL 40 MG PO CAPS
40.0000 mg | ORAL_CAPSULE | Freq: Every day | ORAL | 0 refills | Status: DC
  Filled 2023-11-19 – 2024-01-11 (×2): qty 90, 90d supply, fill #0

## 2023-11-19 MED ORDER — LISDEXAMFETAMINE DIMESYLATE 20 MG PO CAPS
20.0000 mg | ORAL_CAPSULE | Freq: Every morning | ORAL | 0 refills | Status: DC
  Filled 2023-12-28: qty 30, 30d supply, fill #0

## 2023-11-19 MED ORDER — LISDEXAMFETAMINE DIMESYLATE 20 MG PO CAPS
20.0000 mg | ORAL_CAPSULE | Freq: Every morning | ORAL | 0 refills | Status: DC
  Filled 2024-01-29: qty 30, 30d supply, fill #0

## 2023-11-19 MED ORDER — LISDEXAMFETAMINE DIMESYLATE 20 MG PO CAPS
20.0000 mg | ORAL_CAPSULE | Freq: Every morning | ORAL | 0 refills | Status: DC
  Filled 2023-11-27: qty 30, 30d supply, fill #0

## 2023-11-27 ENCOUNTER — Other Ambulatory Visit (HOSPITAL_BASED_OUTPATIENT_CLINIC_OR_DEPARTMENT_OTHER): Payer: Self-pay

## 2023-12-28 ENCOUNTER — Other Ambulatory Visit (HOSPITAL_BASED_OUTPATIENT_CLINIC_OR_DEPARTMENT_OTHER): Payer: Self-pay

## 2024-01-11 ENCOUNTER — Other Ambulatory Visit (HOSPITAL_BASED_OUTPATIENT_CLINIC_OR_DEPARTMENT_OTHER): Payer: Self-pay

## 2024-01-29 ENCOUNTER — Other Ambulatory Visit (HOSPITAL_BASED_OUTPATIENT_CLINIC_OR_DEPARTMENT_OTHER): Payer: Self-pay

## 2024-02-04 ENCOUNTER — Other Ambulatory Visit (HOSPITAL_BASED_OUTPATIENT_CLINIC_OR_DEPARTMENT_OTHER): Payer: Self-pay

## 2024-02-04 ENCOUNTER — Encounter: Payer: Self-pay | Admitting: Family Medicine

## 2024-02-04 ENCOUNTER — Ambulatory Visit: Admitting: Family Medicine

## 2024-02-04 VITALS — BP 122/78 | HR 97 | Temp 97.8°F | Ht 68.0 in | Wt 205.0 lb

## 2024-02-04 DIAGNOSIS — Z8632 Personal history of gestational diabetes: Secondary | ICD-10-CM

## 2024-02-04 DIAGNOSIS — Z6831 Body mass index (BMI) 31.0-31.9, adult: Secondary | ICD-10-CM

## 2024-02-04 DIAGNOSIS — E282 Polycystic ovarian syndrome: Secondary | ICD-10-CM | POA: Diagnosis not present

## 2024-02-04 DIAGNOSIS — K219 Gastro-esophageal reflux disease without esophagitis: Secondary | ICD-10-CM

## 2024-02-04 DIAGNOSIS — R7303 Prediabetes: Secondary | ICD-10-CM | POA: Diagnosis not present

## 2024-02-04 DIAGNOSIS — R1319 Other dysphagia: Secondary | ICD-10-CM

## 2024-02-04 DIAGNOSIS — E669 Obesity, unspecified: Secondary | ICD-10-CM

## 2024-02-04 LAB — COMPREHENSIVE METABOLIC PANEL WITH GFR
ALT: 11 U/L (ref 0–35)
AST: 13 U/L (ref 0–37)
Albumin: 4.4 g/dL (ref 3.5–5.2)
Alkaline Phosphatase: 46 U/L (ref 39–117)
BUN: 12 mg/dL (ref 6–23)
CO2: 31 meq/L (ref 19–32)
Calcium: 9.4 mg/dL (ref 8.4–10.5)
Chloride: 100 meq/L (ref 96–112)
Creatinine, Ser: 0.77 mg/dL (ref 0.40–1.20)
GFR: 96.35 mL/min (ref >60.00)
Glucose, Bld: 104 mg/dL — ABNORMAL HIGH (ref 70–99)
Potassium: 4.3 meq/L (ref 3.5–5.1)
Sodium: 139 meq/L (ref 135–145)
Total Bilirubin: 0.4 mg/dL (ref 0.2–1.2)
Total Protein: 7.4 g/dL (ref 6.0–8.3)

## 2024-02-04 LAB — CBC WITH DIFFERENTIAL/PLATELET
Basophils Absolute: 0.1 10*3/uL (ref 0.0–0.1)
Basophils Relative: 0.8 % (ref 0.0–3.0)
Eosinophils Absolute: 0.2 10*3/uL (ref 0.0–0.7)
Eosinophils Relative: 2.2 % (ref 0.0–5.0)
HCT: 40.7 % (ref 36.0–46.0)
Hemoglobin: 13.6 g/dL (ref 12.0–15.0)
Lymphocytes Relative: 22.5 % (ref 12.0–46.0)
Lymphs Abs: 1.7 10*3/uL (ref 0.7–4.0)
MCHC: 33.4 g/dL (ref 30.0–36.0)
MCV: 92.3 fl (ref 78.0–100.0)
Monocytes Absolute: 0.5 10*3/uL (ref 0.1–1.0)
Monocytes Relative: 6.6 % (ref 3.0–12.0)
Neutro Abs: 5.1 10*3/uL (ref 1.4–7.7)
Neutrophils Relative %: 67.9 % (ref 43.0–77.0)
Platelets: 323 10*3/uL (ref 150.0–400.0)
RBC: 4.41 Mil/uL (ref 3.87–5.11)
RDW: 13.8 % (ref 11.5–15.5)
WBC: 7.6 10*3/uL (ref 4.0–10.5)

## 2024-02-04 LAB — T4, FREE: Free T4: 0.68 ng/dL (ref 0.60–1.60)

## 2024-02-04 LAB — TSH: TSH: 1.05 u[IU]/mL (ref 0.35–5.50)

## 2024-02-04 LAB — HEMOGLOBIN A1C: Hgb A1c MFr Bld: 6.3 % (ref 4.6–6.5)

## 2024-02-04 MED ORDER — METFORMIN HCL ER 500 MG PO TB24
500.0000 mg | ORAL_TABLET | Freq: Every day | ORAL | 1 refills | Status: DC
  Filled 2024-02-04: qty 90, 90d supply, fill #0
  Filled 2024-05-10: qty 90, 90d supply, fill #1

## 2024-02-04 NOTE — Patient Instructions (Signed)
 Please go downstairs for labs before you leave.  Avoid triggers for acid reflux.  Avoid eating and laying down.  Eat small meals and chew your food well.  I recommend avoiding foods difficult to swallow such as meat.  Continue omeprazole .  I referred you to [facility] and they should reach out to schedule you.  Once I have your lab results, we can consider starting metformin .  I will be in touch.

## 2024-02-04 NOTE — Progress Notes (Signed)
 Subjective:     Patient ID: Katrina Phillips, female    DOB: 04-07-1981, 43 y.o.   MRN: [id number]  Chief Complaint  Patient presents with   Gastroesophageal Reflux    Having more intense acid reflux. Taking omeprazole  but worried it isnt a long term medication     Gastroesophageal Reflux She complains of heartburn. She reports no abdominal pain, no chest pain or no nausea. Pertinent negatives include no weight loss.     History of Present Illness         Here with c/o chronic GERD. She started taking omeprazole  4 weeks ago due to worsening symptoms.  She used to only take TUMS.   She wakes up with heartburn.  She is avoiding spicy food. Does not eat and lay down.   Sensation of pills and food getting stuck at times.    Denies abdominal pain, N/V.  Denies changes in bowel habits.   PCOS hx- diagnosed at fertility specialist   Prediabetes- curious about starting metformin    Hx of gestational diabetes and took metformin     She has gained weight     Health Maintenance Due  Topic Date Due   Hepatitis C Screening  Never done    Past Medical History:  Diagnosis Date   Abnormal Pap smear of cervix 03/15/2017   HR HPV: +Detected   ADHD    Allergy    SEASONAL   Anxiety    Asthma    AS A CHILD   Depression    Gestational diabetes    H/O concussion 1995    Past Surgical History:  Procedure Laterality Date   COLPOSCOPY  2006   LEEP     NO PAST SURGERIES     WISDOM TOOTH EXTRACTION      Family History  Problem Relation Age of Onset   ADD / ADHD Mother    GER disease Mother    Celiac disease Father    Depression Father    ADD / ADHD Sister    Colonic polyp Sister    Irritable bowel syndrome Sister    Depression Brother    ADD / ADHD Brother    Depression Maternal Grandmother    GER disease Maternal Grandfather    Heart disease Maternal Grandfather    Celiac disease Paternal Grandmother    Kidney disease Paternal Grandmother    Melanoma  Paternal Aunt    Celiac disease Paternal Uncle     Social History   Socioeconomic History   Marital status: Married    Spouse name: Not on file   Number of children: 0   Years of education: Not on file   Highest education level: Master's degree (e.g., MA, MS, MEng, MEd, MSW, MBA)  Occupational History   Occupation: grad student/assistant  Tobacco Use   Smoking status: Never   Smokeless tobacco: Never  Vaping Use   Vaping status: Never Used  Substance and Sexual Activity   Alcohol use: Yes    Alcohol/week: 1.0 - 4.0 standard drink of alcohol    Types: 1 - 4 Standard drinks or equivalent per week    Comment: social   Drug use: No   Sexual activity: Yes    Partners: Male    Birth control/protection: Condom  Other Topics Concern   Not on file  Social History Narrative   Not on file   Social Drivers of Health   Financial Resource Strain: Low Risk  (02/03/2024)   Overall Financial Resource Strain (CARDIA)  Difficulty of Paying Living Expenses: Not hard at all  Food Insecurity: No Food Insecurity (02/03/2024)   Hunger Vital Sign    Worried About Running Out of Food in the Last Year: Never true    Ran Out of Food in the Last Year: Never true  Transportation Needs: No Transportation Needs (02/03/2024)   PRAPARE - Administrator, Civil Service (Medical): No    Lack of Transportation (Non-Medical): No  Physical Activity: Unknown (02/03/2024)   Exercise Vital Sign    Days of Exercise per Week: 0 days    Minutes of Exercise per Session: Not on file  Stress: Stress Concern Present (02/03/2024)   [employer] of Occupational Health - Occupational Stress Questionnaire    Feeling of Stress : Rather much  Social Connections: Socially Isolated (02/03/2024)   Social Connection and Isolation Panel [NHANES]    Frequency of Communication with Friends and Family: Once a week    Frequency of Social Gatherings with Friends and Family: Once a week    Attends Religious  Services: Never    Database administrator or Organizations: No    Attends Engineer, structural: Not on file    Marital Status: Married  Catering manager Violence: Not on file    Outpatient Medications Prior to Visit  Medication Sig Dispense Refill   Calcium Carbonate Antacid (TUMS PO) Take by mouth.     FLUoxetine  (PROZAC ) 40 MG capsule Take 1 capsule (40 mg total) by mouth daily. 90 capsule 0   lisdexamfetamine (VYVANSE ) 20 MG capsule Take 1 capsule (20 mg total) by mouth every morning. (01-14-24) 30 capsule 0   omeprazole  (PRILOSEC) 40 MG capsule Take 1 capsule (40 mg total) by mouth daily. 30 capsule 3   ARIPiprazole  (ABILIFY ) 2 MG tablet Take 1 tablet (2 mg total) by mouth every evening. (Patient not taking: Reported on 08/26/2023) 90 tablet 0   ARIPiprazole  (ABILIFY ) 5 MG tablet Take 5 mg by mouth daily. (Patient not taking: Reported on 08/26/2023)     Armodafinil  50 MG tablet Take 1-2 tablets (50-100 mg total) by mouth every morning. 60 tablet 2   lisdexamfetamine (VYVANSE ) 20 MG capsule Take 1 capsule (20 mg total) by mouth in the morning. 30 capsule 0   lisdexamfetamine (VYVANSE ) 20 MG capsule Take 1 capsule (20 mg total) by mouth every morning. 30 capsule 0   lisdexamfetamine (VYVANSE ) 20 MG capsule Take 1 capsule (20 mg total) by mouth every morning. 30 capsule 0   lisdexamfetamine (VYVANSE ) 30 MG capsule Take 1 capsule (30 mg total) by mouth in the morning. 30 capsule 0   No facility-administered medications prior to visit.    No Known Allergies  Review of Systems  Constitutional:  Positive for malaise/fatigue. Negative for chills, fever and weight loss.  Respiratory:  Negative for shortness of breath.   Cardiovascular:  Negative for chest pain, palpitations and leg swelling.  Gastrointestinal:  Positive for heartburn. Negative for abdominal pain, constipation, diarrhea, nausea and vomiting.       Pills and food getting stuck in esophagus  Genitourinary:  Negative  for dysuria, frequency and urgency.  Neurological:  Negative for dizziness, focal weakness and headaches.       Objective:    Physical Exam Constitutional:      General: She is not in acute distress.    Appearance: She is not ill-appearing.  HENT:     Mouth/Throat:     Mouth: Mucous membranes are moist.  Eyes:  Extraocular Movements: Extraocular movements intact.     Conjunctiva/sclera: Conjunctivae normal.  Cardiovascular:     Rate and Rhythm: Normal rate and regular rhythm.  Pulmonary:     Effort: Pulmonary effort is normal.     Breath sounds: Normal breath sounds.  Abdominal:     General: Bowel sounds are normal. There is no distension.     Palpations: Abdomen is soft.     Tenderness: There is no abdominal tenderness. There is no guarding or rebound.  Musculoskeletal:     Cervical back: Normal range of motion and neck supple.  Skin:    General: Skin is warm and dry.  Neurological:     General: No focal deficit present.     Mental Status: She is alert and oriented to person, place, and time.  Psychiatric:        Mood and Affect: Mood normal.        Behavior: Behavior normal.        Thought Content: Thought content normal.      BP 122/78 (BP Location: Left Arm, Patient Position: Sitting)   Pulse 97   Temp 97.8 F (36.6 C) (Temporal)   Ht 5\' 8"  (1.727 m)   Wt 205 lb (93 kg)   SpO2 98%   BMI 31.17 kg/m  Wt Readings from Last 3 Encounters:  02/04/24 205 lb (93 kg)  05/11/23 205 lb (93 kg)  05/04/23 200 lb (90.7 kg)       Assessment & Plan:   Problem List Items Addressed This Visit     Esophageal dysphagia   Gastroesophageal reflux disease - Primary   Obesity (BMI 30-39.9)   Relevant Medications   metFORMIN  (GLUCOPHAGE -XR) 500 MG 24 hr tablet   PCOS (polycystic ovarian syndrome)   Relevant Orders   CBC with Differential/Platelet   Comprehensive metabolic panel with GFR   Hemoglobin A1c   Prediabetes   Relevant Orders   CBC with  Differential/Platelet   Comprehensive metabolic panel with GFR   Hemoglobin A1c   TSH   T4, free   Other Visit Diagnoses       History of gestational diabetes          In-depth counseling on lifestyle modifications for GERD.  Continue omeprazole  for now.  Referral to GI for worsening chronic GERD and dysphagia. Prediabetes and PCOS with history of gestational diabetes- start metformin  500 mg XR daily with breakfast.  Limit carbohydrates. Check A1c. Continue follow-up with gynecology as recommended. Follow-up in 4 months or sooner if needed.   I have discontinued Anari E. Buck's ARIPiprazole , ARIPiprazole , and Armodafinil . I am also having her start on metFORMIN . Additionally, I am having her maintain her omeprazole , Calcium Carbonate Antacid (TUMS PO), FLUoxetine , and lisdexamfetamine.  Meds ordered this encounter  Medications   metFORMIN  (GLUCOPHAGE -XR) 500 MG 24 hr tablet    Sig: Take 1 tablet (500 mg total) by mouth daily with breakfast.    Dispense:  90 tablet    Refill:  1    Supervising Provider:   Bambi Lever A [[id number]]

## 2024-02-23 ENCOUNTER — Other Ambulatory Visit (HOSPITAL_BASED_OUTPATIENT_CLINIC_OR_DEPARTMENT_OTHER): Payer: Self-pay

## 2024-02-29 ENCOUNTER — Other Ambulatory Visit (HOSPITAL_BASED_OUTPATIENT_CLINIC_OR_DEPARTMENT_OTHER): Payer: Self-pay

## 2024-02-29 MED ORDER — LISDEXAMFETAMINE DIMESYLATE 20 MG PO CAPS
20.0000 mg | ORAL_CAPSULE | Freq: Every morning | ORAL | 0 refills | Status: DC
  Filled 2024-02-29: qty 30, 30d supply, fill #0

## 2024-03-17 ENCOUNTER — Other Ambulatory Visit (HOSPITAL_BASED_OUTPATIENT_CLINIC_OR_DEPARTMENT_OTHER): Payer: Self-pay

## 2024-03-17 DIAGNOSIS — F339 Major depressive disorder, recurrent, unspecified: Secondary | ICD-10-CM | POA: Diagnosis not present

## 2024-03-17 MED ORDER — FLUOXETINE HCL 40 MG PO CAPS
40.0000 mg | ORAL_CAPSULE | Freq: Every day | ORAL | 0 refills | Status: DC
  Filled 2024-03-17 – 2024-03-29 (×2): qty 90, 90d supply, fill #0

## 2024-03-17 MED ORDER — LISDEXAMFETAMINE DIMESYLATE 20 MG PO CAPS
20.0000 mg | ORAL_CAPSULE | Freq: Every morning | ORAL | 0 refills | Status: AC
  Filled 2024-03-27 – 2024-03-28 (×2): qty 30, 30d supply, fill #0

## 2024-03-17 MED ORDER — AMPHETAMINE-DEXTROAMPHETAMINE 20 MG PO TABS
10.0000 mg | ORAL_TABLET | Freq: Two times a day (BID) | ORAL | 0 refills | Status: DC
  Filled 2024-03-17 – 2024-03-29 (×2): qty 30, 30d supply, fill #0

## 2024-03-27 ENCOUNTER — Other Ambulatory Visit (HOSPITAL_BASED_OUTPATIENT_CLINIC_OR_DEPARTMENT_OTHER): Payer: Self-pay

## 2024-03-28 ENCOUNTER — Other Ambulatory Visit (HOSPITAL_BASED_OUTPATIENT_CLINIC_OR_DEPARTMENT_OTHER): Payer: Self-pay

## 2024-03-29 ENCOUNTER — Other Ambulatory Visit (HOSPITAL_BASED_OUTPATIENT_CLINIC_OR_DEPARTMENT_OTHER): Payer: Self-pay

## 2024-04-07 ENCOUNTER — Ambulatory Visit (INDEPENDENT_AMBULATORY_CARE_PROVIDER_SITE_OTHER): Admitting: Family Medicine

## 2024-04-07 ENCOUNTER — Encounter: Payer: Self-pay | Admitting: Family Medicine

## 2024-04-07 VITALS — BP 124/76 | HR 105 | Temp 97.8°F | Ht 68.0 in | Wt 202.0 lb

## 2024-04-07 DIAGNOSIS — Z0000 Encounter for general adult medical examination without abnormal findings: Secondary | ICD-10-CM | POA: Diagnosis not present

## 2024-04-07 DIAGNOSIS — K219 Gastro-esophageal reflux disease without esophagitis: Secondary | ICD-10-CM

## 2024-04-07 DIAGNOSIS — Z1159 Encounter for screening for other viral diseases: Secondary | ICD-10-CM

## 2024-04-07 DIAGNOSIS — E669 Obesity, unspecified: Secondary | ICD-10-CM

## 2024-04-07 DIAGNOSIS — Z0001 Encounter for general adult medical examination with abnormal findings: Secondary | ICD-10-CM

## 2024-04-07 DIAGNOSIS — E7800 Pure hypercholesterolemia, unspecified: Secondary | ICD-10-CM | POA: Diagnosis not present

## 2024-04-07 DIAGNOSIS — R7303 Prediabetes: Secondary | ICD-10-CM | POA: Diagnosis not present

## 2024-04-07 DIAGNOSIS — Z83719 Family history of colon polyps, unspecified: Secondary | ICD-10-CM | POA: Insufficient documentation

## 2024-04-07 NOTE — Patient Instructions (Addendum)
 Please go downstairs for labs before you leave.  Please schedule with your OB/GYN as discussed.  Consult with her regarding mammogram and HPV vaccines.  I referred you to [facility] and they should reach out to you.  I will see you back as scheduled for your follow-up

## 2024-04-07 NOTE — Progress Notes (Signed)
 Complete physical exam  Patient: Katrina Phillips   DOB: 1981/06/23   43 y.o. Female  MRN: [id number]  Subjective:    Chief Complaint  Patient presents with   Annual Exam    Not fasting   She is here for a complete physical exam. She sees OB/GYN   States she needs a referral to GI due to her sister having a large colon polyp.  She would also like to be seen for her chronic GERD.   Health Maintenance  Topic Date Due   Hepatitis C Screening  Never done   Hepatitis B Vaccine (1 of 3 - 19+ 3-dose series) Never done   HPV Vaccine (1 - 3-dose SCDM series) Never done   Flu Shot  05/12/2024   Pap with HPV screening  10/21/2026   DTaP/Tdap/Td vaccine (6 - Td or Tdap) 07/04/2030   COVID-19 Vaccine  Completed   HIV Screening  Completed   Meningitis B Vaccine  Aged Out    Wears seatbelt always, uses sunscreen, smoke detectors in home and functioning, does not text while driving, feels safe in home environment.  Depression screening:    02/04/2024    8:43 AM 05/11/2023    1:20 PM 05/04/2023    1:16 PM  Depression screen PHQ 2/9  Decreased Interest 2 1 1   Down, Depressed, Hopeless 2 1 1   PHQ - 2 Score 4 2 2   Altered sleeping 0 1 0  Tired, decreased energy 3 3 3   Change in appetite 0 2 2  Feeling bad or failure about yourself  1 1 1   Trouble concentrating 2 1 1   Moving slowly or fidgety/restless 0 0 0  Suicidal thoughts 0 0 0  PHQ-9 Score 10 10 9   Difficult doing work/chores  Somewhat difficult Somewhat difficult   Anxiety Screening:    05/11/2023    1:20 PM  GAD 7 : Generalized Anxiety Score  Nervous, Anxious, on Edge 1  Control/stop worrying 1  Worry too much - different things 1  Trouble relaxing 0  Restless 0  Easily annoyed or irritable 0  Afraid - awful might happen 0  Total GAD 7 Score 3  Anxiety Difficulty Not difficult at all    Vision:Within last year and Dental: No current dental problems and Receives regular dental care  Patient Active Problem  List   Diagnosis Date Noted   Family history of colonic polyps 04/07/2024   Gastroesophageal reflux disease 02/04/2024   Esophageal dysphagia 02/04/2024   Prediabetes 02/04/2024   Obesity (BMI 30-39.9) 02/04/2024   Gallstones 05/11/2023   Nausea 05/04/2023   Pure hypercholesterolemia 10/21/2021   Second degree perineal laceration 09/02/2020   GDM, class A2 09/02/2020   Positive GBS test 09/02/2020   Normal spontaneous vaginal delivery 09/01/2020   Attention deficit hyperactivity disorder (ADHD) 08/18/2018   Depression with anxiety 04/13/2018   PCOS (polycystic ovarian syndrome) 09-02-1981   Past Medical History:  Diagnosis Date   Abnormal Pap smear of cervix 03/15/2017   HR HPV: +Detected   ADHD    Allergy    SEASONAL   Anxiety    Asthma    AS A CHILD   Depression    GERD (gastroesophageal reflux disease) since my 20's   Gestational diabetes    H/O concussion 1995   Past Surgical History:  Procedure Laterality Date   COLPOSCOPY  2006   LEEP     NO PAST SURGERIES     WISDOM TOOTH EXTRACTION  Social History   Tobacco Use   Smoking status: Never   Smokeless tobacco: Never   Tobacco comments:    N/A  Vaping Use   Vaping status: Never Used  Substance Use Topics   Alcohol use: Yes    Alcohol/week: 1.0 - 4.0 standard drink of alcohol    Comment: I drink maybe once a week if there is an occasion   Drug use: No      Patient Care Team: Lendia Boby CROME, NP-C as PCP - General (Family Medicine)   Outpatient Medications Prior to Visit  Medication Sig   amphetamine -dextroamphetamine  (ADDERALL) 20 MG tablet Take 0.5 tablets (10 mg total) by mouth 2 (two) times daily.   Calcium Carbonate Antacid (TUMS PO) Take by mouth.   FLUoxetine  (PROZAC ) 40 MG capsule Take 1 capsule (40 mg total) by mouth daily.   lisdexamfetamine (VYVANSE ) 20 MG capsule Take 1 capsule (20 mg total) by mouth every morning.   metFORMIN  (GLUCOPHAGE -XR) 500 MG 24 hr tablet Take 1 tablet (500 mg  total) by mouth daily with breakfast.   omeprazole  (PRILOSEC) 40 MG capsule Take 1 capsule (40 mg total) by mouth daily.   No facility-administered medications prior to visit.    Review of Systems  Constitutional:  Negative for chills, fever and malaise/fatigue.  HENT:  Negative for congestion, ear pain, sinus pain and sore throat.   Eyes:  Negative for blurred vision, double vision and pain.  Respiratory:  Negative for cough, shortness of breath and wheezing.   Cardiovascular:  Negative for chest pain, palpitations and leg swelling.  Gastrointestinal:  Negative for abdominal pain, constipation, diarrhea, nausea and vomiting.  Genitourinary:  Negative for dysuria, frequency and urgency.  Musculoskeletal:  Negative for back pain, joint pain and myalgias.  Skin:  Negative for rash.  Neurological:  Negative for dizziness, focal weakness and headaches.  Psychiatric/Behavioral:  Negative for depression and suicidal ideas. The patient is not nervous/anxious.        Objective:    BP 124/76 (BP Location: Left Arm, Patient Position: Sitting)   Pulse (!) 105   Temp 97.8 F (36.6 C) (Temporal)   Ht 5' 8 (1.727 m)   Wt 202 lb (91.6 kg)   SpO2 98%   BMI 30.71 kg/m  BP Readings from Last 3 Encounters:  04/07/24 124/76  02/04/24 122/78  08/26/23 112/66   Wt Readings from Last 3 Encounters:  04/07/24 202 lb (91.6 kg)  02/04/24 205 lb (93 kg)  05/11/23 205 lb (93 kg)    Physical Exam Constitutional:      General: She is not in acute distress.    Appearance: She is not ill-appearing.  HENT:     Right Ear: Tympanic membrane, ear canal and external ear normal.     Left Ear: Tympanic membrane, ear canal and external ear normal.     Nose: Nose normal.     Mouth/Throat:     Mouth: Mucous membranes are moist.     Pharynx: Oropharynx is clear.   Eyes:     Extraocular Movements: Extraocular movements intact.     Conjunctiva/sclera: Conjunctivae normal.     Pupils: Pupils are equal,  round, and reactive to light.   Neck:     Thyroid : No thyroid  mass, thyromegaly or thyroid  tenderness.   Cardiovascular:     Rate and Rhythm: Normal rate and regular rhythm.     Pulses: Normal pulses.     Heart sounds: Normal heart sounds.  Pulmonary:  Effort: Pulmonary effort is normal.     Breath sounds: Normal breath sounds.  Abdominal:     General: Bowel sounds are normal.     Palpations: Abdomen is soft.     Tenderness: There is no abdominal tenderness. There is no right CVA tenderness, left CVA tenderness, guarding or rebound.   Musculoskeletal:        General: Normal range of motion.     Cervical back: Normal range of motion and neck supple. No tenderness.     Right lower leg: No edema.     Left lower leg: No edema.  Lymphadenopathy:     Cervical: No cervical adenopathy.   Skin:    General: Skin is warm and dry.     Findings: No lesion or rash.   Neurological:     General: No focal deficit present.     Mental Status: She is alert and oriented to person, place, and time.     Cranial Nerves: No cranial nerve deficit.     Sensory: No sensory deficit.     Motor: No weakness.     Gait: Gait normal.   Psychiatric:        Mood and Affect: Mood normal.        Behavior: Behavior normal.        Thought Content: Thought content normal.      No results found for any visits on 04/07/24.    Assessment & Plan:    Routine Health Maintenance and Physical Exam  Problem List Items Addressed This Visit     Family history of colonic polyps   Relevant Orders   Ambulatory referral to Gastroenterology   Gastroesophageal reflux disease   Relevant Orders   Ambulatory referral to Gastroenterology   Obesity (BMI 30-39.9)   Prediabetes   Pure hypercholesterolemia   Relevant Orders   Lipid panel   Other Visit Diagnoses       Encounter for general adult medical examination with abnormal findings    -  Primary     Encounter for screening for other viral diseases        Relevant Orders   Hepatitis C antibody      Preventive health care reviewed.  Counseling on healthy lifestyle including diet and exercise.  Recommend regular dental and eye exams.  Immunizations reviewed.  Discussed safety.  Referral to GI for GERD and family hx of polyps.  Follow up with OB/GYN and check on HPV vaccines due to history of abnormal Pap smears. Check labs for prediabetes and hyperlipidemia.   No follow-ups on file.     Boby Mackintosh, NP-C

## 2024-04-17 ENCOUNTER — Other Ambulatory Visit: Payer: Self-pay | Admitting: Family Medicine

## 2024-04-17 DIAGNOSIS — Z1231 Encounter for screening mammogram for malignant neoplasm of breast: Secondary | ICD-10-CM

## 2024-04-19 ENCOUNTER — Ambulatory Visit
Admission: RE | Admit: 2024-04-19 | Discharge: 2024-04-19 | Disposition: A | Discharge status: Home / Self Care | Source: Ambulatory Visit | Attending: Family Medicine | Admitting: Family Medicine

## 2024-04-19 DIAGNOSIS — Z1231 Encounter for screening mammogram for malignant neoplasm of breast: Secondary | ICD-10-CM | POA: Diagnosis not present

## 2024-04-21 DIAGNOSIS — F339 Major depressive disorder, recurrent, unspecified: Secondary | ICD-10-CM | POA: Diagnosis not present

## 2024-04-26 ENCOUNTER — Other Ambulatory Visit (HOSPITAL_BASED_OUTPATIENT_CLINIC_OR_DEPARTMENT_OTHER): Payer: Self-pay

## 2024-04-27 ENCOUNTER — Other Ambulatory Visit (HOSPITAL_BASED_OUTPATIENT_CLINIC_OR_DEPARTMENT_OTHER): Payer: Self-pay

## 2024-04-27 MED ORDER — LISDEXAMFETAMINE DIMESYLATE 20 MG PO CAPS
20.0000 mg | ORAL_CAPSULE | Freq: Every morning | ORAL | 0 refills | Status: AC
  Filled 2024-04-27: qty 30, 30d supply, fill #0

## 2024-04-27 MED ORDER — FLUOXETINE HCL 40 MG PO CAPS
40.0000 mg | ORAL_CAPSULE | Freq: Every day | ORAL | 0 refills | Status: DC
  Filled 2024-06-19: qty 90, 90d supply, fill #0

## 2024-04-27 MED ORDER — LISDEXAMFETAMINE DIMESYLATE 20 MG PO CAPS
20.0000 mg | ORAL_CAPSULE | Freq: Every morning | ORAL | 0 refills | Status: DC
  Filled 2024-05-26: qty 30, 30d supply, fill #0

## 2024-04-27 MED ORDER — AMPHETAMINE-DEXTROAMPHETAMINE 20 MG PO TABS
10.0000 mg | ORAL_TABLET | Freq: Two times a day (BID) | ORAL | 0 refills | Status: DC
  Filled 2024-04-27: qty 30, 30d supply, fill #0

## 2024-05-11 ENCOUNTER — Other Ambulatory Visit (HOSPITAL_BASED_OUTPATIENT_CLINIC_OR_DEPARTMENT_OTHER): Payer: Self-pay

## 2024-05-26 ENCOUNTER — Encounter: Payer: Self-pay | Admitting: Radiology

## 2024-05-26 ENCOUNTER — Other Ambulatory Visit (HOSPITAL_BASED_OUTPATIENT_CLINIC_OR_DEPARTMENT_OTHER): Payer: Self-pay

## 2024-05-26 ENCOUNTER — Ambulatory Visit (INDEPENDENT_AMBULATORY_CARE_PROVIDER_SITE_OTHER): Admitting: Radiology

## 2024-05-26 VITALS — BP 104/66 | HR 113 | Ht 69.0 in | Wt 198.0 lb

## 2024-05-26 DIAGNOSIS — Z1331 Encounter for screening for depression: Secondary | ICD-10-CM | POA: Diagnosis not present

## 2024-05-26 DIAGNOSIS — N952 Postmenopausal atrophic vaginitis: Secondary | ICD-10-CM | POA: Diagnosis not present

## 2024-05-26 DIAGNOSIS — Z01419 Encounter for gynecological examination (general) (routine) without abnormal findings: Secondary | ICD-10-CM

## 2024-05-26 MED ORDER — ESTRADIOL 0.1 MG/GM VA CREA
1.0000 g | TOPICAL_CREAM | VAGINAL | 3 refills | Status: AC
Start: 2024-05-26 — End: ?
  Filled 2024-05-26: qty 42.5, 90d supply, fill #0

## 2024-05-26 NOTE — Progress Notes (Signed)
 Katrina Phillips 05/25/1981 [id number]   History:  43 y.o. G3P1 presents for annual exam. C/o vaginal dryness and discomfort and thinning around her clitoral hood. Using a lubricant for intercourse. LEEP 2018, normal paps since.  Gynecologic History Patient's last menstrual period was 04/25/2024 (approximate). Period Cycle (Days): 32 (hx PCOS) Period Duration (Days): 5 Period Pattern: (!) Irregular Menstrual Flow: Moderate Menstrual Control: Thin pad, Maxi pad, Tampon Dysmenorrhea: (!) Mild Dysmenorrhea Symptoms: Cramping Contraception/Family planning: condoms Sexually active: yes Last Pap: 2023. Results were: normal. Last mammogram: 2025. Results were: normal  Obstetric History OB History  Gravida Para Term Preterm AB Living  3 2 2  0 1 1  SAB IAB Ectopic Multiple Live Births  0 1 0 0 1    # Outcome Date GA Lbr Len/2nd Weight Sex Type Anes PTL Lv  3 Term 09/01/20 [redacted]w[redacted]d 03:31 / 01:04 7 lb 10.2 oz (3.464 kg) M Vag-Spont None  LIV  2 Term 09/08/19 [redacted]w[redacted]d 00:46 / 00:20 5 lb 15.4 oz (2.705 kg) F Vag-Spont EPI  FD  1 IAB 2015               05/26/2024    8:02 AM 02/04/2024    8:43 AM 05/11/2023    1:20 PM  Depression screen PHQ 2/9  Decreased Interest 1 2 1   Down, Depressed, Hopeless 2 2 1   PHQ - 2 Score 3 4 2   Altered sleeping 2 0 1  Tired, decreased energy 3 3 3   Change in appetite 0 0 2  Feeling bad or failure about yourself  1 1 1   Trouble concentrating 3 2 1   Moving slowly or fidgety/restless 0 0 0  Suicidal thoughts 0 0 0  PHQ-9 Score 12 10 10   Difficult doing work/chores Somewhat difficult  Somewhat difficult     The following portions of the patient's history were reviewed and updated as appropriate: allergies, current medications, past family history, past medical history, past social history, past surgical history, and problem list.  Review of Systems  All other systems reviewed and are negative.   Past medical history, past surgical history, family  history and social history were all reviewed and documented in the EPIC chart.  Exam:  Vitals:   05/26/24 0801  BP: 104/66  Pulse: (!) 113  SpO2: 96%  Weight: 198 lb (89.8 kg)  Height: 5' 9 (1.753 m)   Body mass index is 29.24 kg/m.  Physical Exam Vitals and nursing note reviewed. Exam conducted with a chaperone present.  Constitutional:      Appearance: Normal appearance. She is normal weight.  HENT:     Head: Normocephalic and atraumatic.  Neck:     Thyroid: No thyroid mass, thyromegaly or thyroid tenderness.  Cardiovascular:     Rate and Rhythm: Regular rhythm.     Heart sounds: Normal heart sounds.  Pulmonary:     Effort: Pulmonary effort is normal.     Breath sounds: Normal breath sounds.  Chest:  Breasts:    Breasts are symmetrical.     Right: Normal. No inverted nipple, mass, nipple discharge, skin change or tenderness.     Left: Normal. No inverted nipple, mass, nipple discharge, skin change or tenderness.  Abdominal:     General: Abdomen is flat. Bowel sounds are normal.     Palpations: Abdomen is soft.  Genitourinary:    Vagina: Normal. No vaginal discharge, bleeding or lesions.     Cervix: Normal. No discharge or lesion.  Uterus: Normal. Not enlarged and not tender.      Adnexa: Right adnexa normal and left adnexa normal.       Right: No mass, tenderness or fullness.         Left: No mass, tenderness or fullness.       Comments: Atrophy of upper labia minora including clitoral hood Lymphadenopathy:     Upper Body:     Right upper body: No axillary adenopathy.     Left upper body: No axillary adenopathy.  Skin:    General: Skin is warm and dry.  Neurological:     Mental Status: She is alert and oriented to person, place, and time.  Psychiatric:        Mood and Affect: Mood normal.        Thought Content: Thought content normal.        Judgment: Judgment normal.      Darice Hoit, CMA present for exam  Assessment/Plan:   1. Well woman exam  with routine gynecological exam (Primary) Pap 2026  2. Atrophic vulvovaginitis - estradiol (ESTRACE VAGINAL) 0.1 MG/GM vaginal cream; Place 1 g vaginally 3 (three) times a week.  Dispense: 42.5 g; Refill: 3  3. Depression screen     Return for Annual.  GINETTE SHASTA NOVAK WHNP-BC 8:26 AM 05/26/2024

## 2024-06-06 ENCOUNTER — Encounter: Payer: Self-pay | Admitting: Family Medicine

## 2024-06-06 ENCOUNTER — Ambulatory Visit: Admitting: Family Medicine

## 2024-06-06 VITALS — BP 102/72 | HR 87 | Temp 97.9°F | Ht 69.0 in | Wt 196.0 lb

## 2024-06-06 DIAGNOSIS — Z8619 Personal history of other infectious and parasitic diseases: Secondary | ICD-10-CM

## 2024-06-06 DIAGNOSIS — E7800 Pure hypercholesterolemia, unspecified: Secondary | ICD-10-CM

## 2024-06-06 DIAGNOSIS — R7303 Prediabetes: Secondary | ICD-10-CM

## 2024-06-06 DIAGNOSIS — K219 Gastro-esophageal reflux disease without esophagitis: Secondary | ICD-10-CM

## 2024-06-06 DIAGNOSIS — Z23 Encounter for immunization: Secondary | ICD-10-CM | POA: Diagnosis not present

## 2024-06-06 DIAGNOSIS — G4711 Idiopathic hypersomnia with long sleep time: Secondary | ICD-10-CM

## 2024-06-06 DIAGNOSIS — R5383 Other fatigue: Secondary | ICD-10-CM

## 2024-06-06 NOTE — Patient Instructions (Signed)
 Please follow-up tomorrow morning for fasting labs.  Come in well-hydrated.  You will need your second HPV vaccine in 1 to 2 months and your third 1 in 6 months from today.  You can schedule nurse visits for these.

## 2024-06-06 NOTE — Progress Notes (Signed)
 Subjective:     Patient ID: Katrina Phillips, female    DOB: Jan 08, 1981, 43 y.o.   MRN: [id number]  Chief Complaint  Patient presents with   Medical Management of Chronic Issues    4 month f/u    HPI  Discussed the use of AI scribe software for clinical note transcription with the patient, who gave verbal consent to proceed.  History of Present Illness Katrina Phillips is a 43 year old female with prediabetes and hyperlipidemia who presents for a four month follow-up on chronic health conditions.  Impaired glucose tolerance - Diagnosed with prediabetes in April 2025 with an A1c of 6.3% - Started metformin  since last visit and tolerates it well - Implemented dietary modifications, including reduced carbohydrate intake by avoiding pasta and rice and choosing thinner sliced bread  Hyperlipidemia - Uncertain of the origin of diagnosis - Did not fast for today's visit and plans to return for fasting lipid panel  Gastroesophageal reflux symptoms - Takes omeprazole , finds it helpful - Requires additional antacids such as Tums intermittently - Remembers to take omeprazole  approximately half the time  Chronic fatigue and hypersomnolence - 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 after a sleep study - Experiences chronic fatigue - Takes stimulants for hypersomnolence, which also assist with appetite control - No dizziness, chest pain, palpitations, shortness of breath, nausea, vomiting, diarrhea, or constipation  Gynecologic history - History of HPV infection - Up to date on Pap smears following LEEP procedure in her thirties - History of polycystic ovary syndrome (PCOS)     Health Maintenance Due  Topic Date Due   Hepatitis C Screening  Never done   Hepatitis B Vaccines 19-59 Average Risk (1 of 3 - 19+ 3-dose series) Never done   INFLUENZA VACCINE  05/12/2024   HPV VACCINES (2 - 3-dose SCDM series) 07/04/2024    Past Medical  History:  Diagnosis Date   Abnormal Pap smear of cervix 03/15/2017   HR HPV: +Detected   ADHD    Allergy    SEASONAL   Anxiety    Asthma    AS A CHILD   Depression    GERD (gastroesophageal reflux disease) since my 20's   Gestational diabetes    H/O concussion 1995    Past Surgical History:  Procedure Laterality Date   COLPOSCOPY  2006   LEEP     2018   NO PAST SURGERIES     WISDOM TOOTH EXTRACTION      Family History  Problem Relation Age of Onset   ADD / ADHD Mother    GER disease Mother    Celiac disease Father    Depression Father    Anxiety disorder Father    ADD / ADHD Sister    Colonic polyp Sister    Irritable bowel syndrome Sister    Anxiety disorder Sister    Depression Brother    ADD / ADHD Brother    Depression Maternal Grandmother    Varicose Veins Maternal Grandmother    GER disease Maternal Grandfather    Heart disease Maternal Grandfather    Cancer Maternal Grandfather    Celiac disease Paternal Grandmother    Kidney disease Paternal Grandmother    Melanoma Paternal Aunt    Celiac disease Paternal Uncle    Alcohol abuse Maternal Uncle    Depression Maternal Uncle    Anxiety disorder Paternal Aunt    Cancer Paternal Aunt     Social History  Socioeconomic History   Marital status: Married    Spouse name: Not on file   Number of children: 0   Years of education: Not on file   Highest education level: Master's degree (e.g., MA, MS, MEng, MEd, MSW, MBA)  Occupational History   Occupation: grad student/assistant  Tobacco Use   Smoking status: Never    Passive exposure: Past   Smokeless tobacco: Never   Tobacco comments:    N/A  Vaping Use   Vaping status: Never Used  Substance and Sexual Activity   Alcohol use: Yes    Alcohol/week: 1.0 - 4.0 standard drink of alcohol    Comment: I drink maybe once a week if there is an occasion   Drug use: No   Sexual activity: Yes    Partners: Male    Birth control/protection: Condom     Comment: menarche 43yo, sexual debut 43yo  Other Topics Concern   Not on file  Social History Narrative   Not on file   Social Drivers of Health   Financial Resource Strain: Low Risk  (04/06/2024)   Overall Financial Resource Strain (CARDIA)    Difficulty of Paying Living Expenses: Not very hard  Food Insecurity: No Food Insecurity (04/06/2024)   Hunger Vital Sign    Worried About Running Out of Food in the Last Year: Never true    Ran Out of Food in the Last Year: Never true  Transportation Needs: No Transportation Needs (04/06/2024)   PRAPARE - Administrator, Civil Service (Medical): No    Lack of Transportation (Non-Medical): No  Physical Activity: Inactive (04/06/2024)   Exercise Vital Sign    Days of Exercise per Week: 0 days    Minutes of Exercise per Session: Not on file  Stress: Stress Concern Present (04/06/2024)   [employer] of Occupational Health - Occupational Stress Questionnaire    Feeling of Stress: Very much  Social Connections: Socially Isolated (04/06/2024)   Social Connection and Isolation Panel    Frequency of Communication with Friends and Family: Once a week    Frequency of Social Gatherings with Friends and Family: Never    Attends Religious Services: Never    Database administrator or Organizations: No    Attends Engineer, structural: Not on file    Marital Status: Married  Catering manager Violence: Not on file    Outpatient Medications Prior to Visit  Medication Sig Dispense Refill   amphetamine -dextroamphetamine  (ADDERALL) 20 MG tablet Take 0.5 tablets (10 mg total) by mouth 2 (two) times daily. 30 tablet 0   Calcium Carbonate Antacid (TUMS PO) Take by mouth.     estradiol  (ESTRACE  VAGINAL) 0.1 MG/GM vaginal cream Place 1 gram vaginally 3 (three) times a week. 42.5 g 3   FLUoxetine  (PROZAC ) 40 MG capsule Take 1 capsule (40 mg total) by mouth daily. 90 capsule 0   lisdexamfetamine (VYVANSE ) 20 MG capsule Take 1 capsule (20 mg  total) by mouth every morning. 30 capsule 0   lisdexamfetamine (VYVANSE ) 20 MG capsule Take 1 capsule (20 mg total) by mouth in the morning. 30 capsule 0   lisdexamfetamine (VYVANSE ) 20 MG capsule Take 1 capsule (20 mg total) by mouth in the morning. 30 capsule 0   metFORMIN  (GLUCOPHAGE -XR) 500 MG 24 hr tablet Take 1 tablet (500 mg total) by mouth daily with breakfast. 90 tablet 1   omeprazole  (PRILOSEC) 40 MG capsule Take 1 capsule (40 mg total) by mouth daily. 30 capsule 3  No facility-administered medications prior to visit.    No Known Allergies  Review of Systems  Constitutional:  Positive for malaise/fatigue. Negative for chills and fever.       Ongoing   Respiratory:  Negative for cough and shortness of breath.   Cardiovascular:  Negative for chest pain, palpitations and leg swelling.  Gastrointestinal:  Negative for abdominal pain, constipation, diarrhea, nausea and vomiting.  Genitourinary:  Negative for dysuria, frequency and urgency.  Musculoskeletal:  Negative for joint pain, myalgias and neck pain.  Neurological:  Negative for dizziness, focal weakness and headaches.       Objective:    Physical Exam Constitutional:      General: She is not in acute distress.    Appearance: She is not ill-appearing.  HENT:     Mouth/Throat:     Mouth: Mucous membranes are moist.     Pharynx: Oropharynx is clear.  Eyes:     Extraocular Movements: Extraocular movements intact.     Conjunctiva/sclera: Conjunctivae normal.  Cardiovascular:     Rate and Rhythm: Normal rate.  Pulmonary:     Effort: Pulmonary effort is normal.  Musculoskeletal:     Cervical back: Normal range of motion and neck supple.  Skin:    General: Skin is warm and dry.  Neurological:     General: No focal deficit present.     Mental Status: She is alert and oriented to person, place, and time.     Cranial Nerves: No cranial nerve deficit.     Motor: No weakness.     Coordination: Coordination normal.      Gait: Gait normal.  Psychiatric:        Mood and Affect: Mood normal.        Behavior: Behavior normal.        Thought Content: Thought content normal.      BP 102/72   Pulse 87   Temp 97.9 F (36.6 C) (Temporal)   Ht 5' 9 (1.753 m)   Wt 196 lb (88.9 kg)   LMP 04/25/2024 (Approximate)   SpO2 98%   BMI 28.94 kg/m  Wt Readings from Last 3 Encounters:  06/06/24 196 lb (88.9 kg)  05/26/24 198 lb (89.8 kg)  04/07/24 202 lb (91.6 kg)       Assessment & Plan:   Problem List Items Addressed This Visit     Fatigue   Relevant Orders   Ferritin   Vitamin B12   TSH   T4, free   Gastroesophageal reflux disease   History of HPV infection   Idiopathic hypersomnolence   Prediabetes - Primary   Relevant Orders   CBC with Differential/Platelet   Comprehensive metabolic panel with GFR   Hemoglobin A1c   Pure hypercholesterolemia   Relevant Orders   Lipid panel   Other Visit Diagnoses       Immunization due       Relevant Orders   HPV 9-valent vaccine,Recombinat (Completed)       Assessment and Plan Assessment & Plan Prediabetes Prediabetes with an A1c of 6.3% in April 2025. She reports good tolerance to metformin  and has implemented dietary modifications, including reducing carbohydrate intake. - Recheck A1c to assess current status of prediabetes.  Gastroesophageal reflux disease She is taking omeprazole  but requires additional antacids like Tums for symptom control.  Pure hypercholesterolemia Diagnosis of hyperlipidemia is present, but the origin is unclear. She does not recall recent cholesterol testing. - Order fasting lipid panel. - Instruct to return  for fasting blood work tomorrow morning.  Idiopathic hypersomnolence Diagnosed at age 88 and managed with stimulants.  History of HPV infection and leep procedure She is up to date on Pap smears. Discussed HPV vaccination due to history of HPV infection, and she agreed to start the HPV vaccine series. -  Administer first dose of HPV vaccine today. - Schedule second dose of HPV vaccine in 1-2 months. - Schedule third dose of HPV vaccine in 6 months.  Fatigue Chronic fatigue attributed to PCOS and idiopathic hypersomnolence.     I am having Shayonna E. Hand maintain her omeprazole , Calcium Carbonate Antacid (TUMS PO), metFORMIN , lisdexamfetamine, FLUoxetine , lisdexamfetamine, amphetamine -dextroamphetamine , lisdexamfetamine, and estradiol .  No orders of the defined types were placed in this encounter.

## 2024-06-07 ENCOUNTER — Other Ambulatory Visit (INDEPENDENT_AMBULATORY_CARE_PROVIDER_SITE_OTHER)

## 2024-06-07 DIAGNOSIS — R5383 Other fatigue: Secondary | ICD-10-CM | POA: Diagnosis not present

## 2024-06-07 DIAGNOSIS — Z1159 Encounter for screening for other viral diseases: Secondary | ICD-10-CM

## 2024-06-07 DIAGNOSIS — E7800 Pure hypercholesterolemia, unspecified: Secondary | ICD-10-CM | POA: Diagnosis not present

## 2024-06-07 DIAGNOSIS — R7303 Prediabetes: Secondary | ICD-10-CM | POA: Diagnosis not present

## 2024-06-07 LAB — CBC WITH DIFFERENTIAL/PLATELET
Basophils Absolute: 0.1 K/uL (ref 0.0–0.1)
Basophils Relative: 0.9 % (ref 0.0–3.0)
Eosinophils Absolute: 0.2 K/uL (ref 0.0–0.7)
Eosinophils Relative: 2.6 % (ref 0.0–5.0)
HCT: 40 % (ref 36.0–46.0)
Hemoglobin: 13.1 g/dL (ref 12.0–15.0)
Lymphocytes Relative: 19.3 % (ref 12.0–46.0)
Lymphs Abs: 1.3 K/uL (ref 0.7–4.0)
MCHC: 32.9 g/dL (ref 30.0–36.0)
MCV: 91.8 fl (ref 78.0–100.0)
Monocytes Absolute: 0.4 K/uL (ref 0.1–1.0)
Monocytes Relative: 6.5 % (ref 3.0–12.0)
Neutro Abs: 4.9 K/uL (ref 1.4–7.7)
Neutrophils Relative %: 70.7 % (ref 43.0–77.0)
Platelets: 315 K/uL (ref 150.0–400.0)
RBC: 4.35 Mil/uL (ref 3.87–5.11)
RDW: 14 % (ref 11.5–15.5)
WBC: 6.9 K/uL (ref 4.0–10.5)

## 2024-06-07 LAB — LIPID PANEL
Cholesterol: 172 mg/dL (ref 0–200)
HDL: 46.1 mg/dL (ref >39.00)
LDL Cholesterol: 112 mg/dL — ABNORMAL HIGH (ref 0–99)
NonHDL: 126.19
Total CHOL/HDL Ratio: 4
Triglycerides: 70 mg/dL (ref 0.0–149.0)
VLDL: 14 mg/dL (ref 0.0–40.0)

## 2024-06-07 LAB — COMPREHENSIVE METABOLIC PANEL WITH GFR
ALT: 7 U/L (ref 0–35)
AST: 10 U/L (ref 0–37)
Albumin: 4.2 g/dL (ref 3.5–5.2)
Alkaline Phosphatase: 39 U/L (ref 39–117)
BUN: 8 mg/dL (ref 6–23)
CO2: 29 meq/L (ref 19–32)
Calcium: 8.7 mg/dL (ref 8.4–10.5)
Chloride: 101 meq/L (ref 96–112)
Creatinine, Ser: 0.74 mg/dL (ref 0.40–1.20)
GFR: 100.82 mL/min (ref >60.00)
Glucose, Bld: 107 mg/dL — ABNORMAL HIGH (ref 70–99)
Potassium: 4.6 meq/L (ref 3.5–5.1)
Sodium: 140 meq/L (ref 135–145)
Total Bilirubin: 0.4 mg/dL (ref 0.2–1.2)
Total Protein: 7.4 g/dL (ref 6.0–8.3)

## 2024-06-07 LAB — HEMOGLOBIN A1C: Hgb A1c MFr Bld: 6.4 % (ref 4.6–6.5)

## 2024-06-07 LAB — T4, FREE: Free T4: 0.76 ng/dL (ref 0.60–1.60)

## 2024-06-07 LAB — TSH: TSH: 0.97 u[IU]/mL (ref 0.35–5.50)

## 2024-06-07 LAB — FERRITIN: Ferritin: 8 ng/mL — ABNORMAL LOW (ref 10.0–291.0)

## 2024-06-07 LAB — VITAMIN B12: Vitamin B-12: 193 pg/mL — ABNORMAL LOW (ref 211–911)

## 2024-06-08 ENCOUNTER — Ambulatory Visit: Payer: Self-pay | Admitting: Family Medicine

## 2024-06-08 LAB — HEPATITIS C ANTIBODY: Hepatitis C Ab: NONREACTIVE

## 2024-06-08 NOTE — Progress Notes (Signed)
 She needs vitamin B12 injections weekly x 4 weeks and then monthly going forward.

## 2024-06-13 ENCOUNTER — Other Ambulatory Visit (HOSPITAL_BASED_OUTPATIENT_CLINIC_OR_DEPARTMENT_OTHER): Payer: Self-pay

## 2024-06-13 ENCOUNTER — Telehealth: Payer: Self-pay

## 2024-06-13 ENCOUNTER — Other Ambulatory Visit: Payer: Self-pay

## 2024-06-13 DIAGNOSIS — R7303 Prediabetes: Secondary | ICD-10-CM

## 2024-06-13 MED ORDER — METFORMIN HCL ER 500 MG PO TB24
1000.0000 mg | ORAL_TABLET | Freq: Every day | ORAL | 1 refills | Status: AC
  Filled 2024-06-13 – 2024-06-19 (×2): qty 90, 45d supply, fill #0
  Filled 2024-10-28: qty 90, 45d supply, fill #1

## 2024-06-13 NOTE — Telephone Encounter (Signed)
 Copied from CRM #[id number]. Topic: Clinical - Medical Advice >> Jun 13, 2024  1:56 PM Viola F wrote: Reason for CRM: Patient says Boby Mackintosh wants her to have weekly b12 injections - her first one is 06/16/24 - she wants to know if 06/21/24 will be too soon to schedule since she can't come 9/11 or 9/12. She also wants to know how many weekly b12 injections she needs until he starts doing them monthly.

## 2024-06-14 NOTE — Telephone Encounter (Signed)
 LVM for patient, per provider she will need 4 weekly or biweekly injections before switching to monthly injections. Also recommends taking an OTC oral b12 supplement as she may not be absorbing b12 properly in the body.

## 2024-06-16 ENCOUNTER — Ambulatory Visit (INDEPENDENT_AMBULATORY_CARE_PROVIDER_SITE_OTHER)

## 2024-06-16 DIAGNOSIS — E538 Deficiency of other specified B group vitamins: Secondary | ICD-10-CM

## 2024-06-16 MED ORDER — CYANOCOBALAMIN 1000 MCG/ML IJ SOLN
1000.0000 ug | Freq: Once | INTRAMUSCULAR | Status: AC
  Administered 2024-06-16: 1000 ug via INTRAMUSCULAR

## 2024-06-16 NOTE — Progress Notes (Signed)
 Pt here for 1/4 weekly B12 injection per Boby Mackintosh, NP  B12 1000mcg given IM and pt tolerated injection well.  Next B12 injection scheduled for NEXT FRIDAY for her 2/4 weekly b12 injection.

## 2024-06-19 ENCOUNTER — Other Ambulatory Visit (HOSPITAL_BASED_OUTPATIENT_CLINIC_OR_DEPARTMENT_OTHER): Payer: Self-pay

## 2024-06-19 ENCOUNTER — Other Ambulatory Visit: Payer: Self-pay

## 2024-06-23 ENCOUNTER — Ambulatory Visit (INDEPENDENT_AMBULATORY_CARE_PROVIDER_SITE_OTHER): Admitting: Radiology

## 2024-06-23 DIAGNOSIS — E538 Deficiency of other specified B group vitamins: Secondary | ICD-10-CM | POA: Diagnosis not present

## 2024-06-23 MED ORDER — CYANOCOBALAMIN 1000 MCG/ML IJ SOLN
1000.0000 ug | Freq: Once | INTRAMUSCULAR | Status: AC
  Administered 2024-06-23: 1000 ug via INTRAMUSCULAR

## 2024-06-23 NOTE — Progress Notes (Signed)
 Patient here for Weekly B12 injection. Patient tolerated well with no complications.

## 2024-06-28 ENCOUNTER — Other Ambulatory Visit: Payer: Self-pay

## 2024-06-28 ENCOUNTER — Other Ambulatory Visit (HOSPITAL_BASED_OUTPATIENT_CLINIC_OR_DEPARTMENT_OTHER): Payer: Self-pay

## 2024-06-28 DIAGNOSIS — F339 Major depressive disorder, recurrent, unspecified: Secondary | ICD-10-CM | POA: Diagnosis not present

## 2024-06-28 MED ORDER — LISDEXAMFETAMINE DIMESYLATE 20 MG PO CAPS
20.0000 mg | ORAL_CAPSULE | Freq: Every morning | ORAL | 0 refills | Status: AC
  Filled 2024-07-28: qty 30, 30d supply, fill #0

## 2024-06-28 MED ORDER — LISDEXAMFETAMINE DIMESYLATE 20 MG PO CAPS
20.0000 mg | ORAL_CAPSULE | Freq: Every morning | ORAL | 0 refills | Status: AC
  Filled 2024-08-28 (×2): qty 30, 30d supply, fill #0

## 2024-06-28 MED ORDER — FLUOXETINE HCL 40 MG PO CAPS
40.0000 mg | ORAL_CAPSULE | Freq: Every day | ORAL | 0 refills | Status: DC
  Filled 2024-06-28: qty 90, 90d supply, fill #0

## 2024-06-28 MED ORDER — LISDEXAMFETAMINE DIMESYLATE 20 MG PO CAPS
20.0000 mg | ORAL_CAPSULE | Freq: Every morning | ORAL | 0 refills | Status: AC
  Filled 2024-06-28: qty 30, 30d supply, fill #0

## 2024-06-28 MED ORDER — AMPHETAMINE-DEXTROAMPHETAMINE 20 MG PO TABS
10.0000 mg | ORAL_TABLET | Freq: Two times a day (BID) | ORAL | 0 refills | Status: AC
  Filled 2024-06-28: qty 30, 30d supply, fill #0

## 2024-06-30 ENCOUNTER — Ambulatory Visit (INDEPENDENT_AMBULATORY_CARE_PROVIDER_SITE_OTHER)

## 2024-06-30 DIAGNOSIS — E538 Deficiency of other specified B group vitamins: Secondary | ICD-10-CM | POA: Diagnosis not present

## 2024-06-30 MED ORDER — CYANOCOBALAMIN 1000 MCG/ML IJ SOLN
1000.0000 ug | Freq: Once | INTRAMUSCULAR | Status: AC
  Administered 2024-06-30: 1000 ug via INTRAMUSCULAR

## 2024-06-30 NOTE — Progress Notes (Signed)
 Pt here for monthly B12 injection per   B12 1000mcg given IM and pt tolerated injection well.  Pt responded well to injection there were no complications

## 2024-07-06 ENCOUNTER — Ambulatory Visit (INDEPENDENT_AMBULATORY_CARE_PROVIDER_SITE_OTHER): Admitting: Family Medicine

## 2024-07-06 DIAGNOSIS — E538 Deficiency of other specified B group vitamins: Secondary | ICD-10-CM

## 2024-07-06 MED ORDER — CYANOCOBALAMIN 1000 MCG/ML IJ SOLN
1000.0000 ug | Freq: Once | INTRAMUSCULAR | Status: AC
  Administered 2024-07-06: 1000 ug via INTRAMUSCULAR

## 2024-07-06 NOTE — Progress Notes (Signed)
 Pt here for monthly B12 injection per PCP  B12 1000mcg given IM L deltoid, and pt tolerated injection well.  Next B12 injection scheduled for: 08/03/2024

## 2024-07-10 ENCOUNTER — Ambulatory Visit (INDEPENDENT_AMBULATORY_CARE_PROVIDER_SITE_OTHER)

## 2024-07-10 DIAGNOSIS — Z23 Encounter for immunization: Secondary | ICD-10-CM | POA: Diagnosis not present

## 2024-07-10 NOTE — Progress Notes (Signed)
 After obtaining consent, and per orders of [facility] NP-C, injection of HPV given by Ronnald SHAUNNA Palms. Patient  report any adverse reaction to me immediately.

## 2024-07-14 ENCOUNTER — Ambulatory Visit: Admitting: Gastroenterology

## 2024-07-14 ENCOUNTER — Other Ambulatory Visit (HOSPITAL_BASED_OUTPATIENT_CLINIC_OR_DEPARTMENT_OTHER): Payer: Self-pay

## 2024-07-14 ENCOUNTER — Encounter: Payer: Self-pay | Admitting: Gastroenterology

## 2024-07-14 ENCOUNTER — Ambulatory Visit

## 2024-07-14 VITALS — BP 120/80 | Ht 69.0 in | Wt 194.0 lb

## 2024-07-14 DIAGNOSIS — K219 Gastro-esophageal reflux disease without esophagitis: Secondary | ICD-10-CM

## 2024-07-14 DIAGNOSIS — Z83719 Family history of colon polyps, unspecified: Secondary | ICD-10-CM | POA: Diagnosis not present

## 2024-07-14 DIAGNOSIS — R1310 Dysphagia, unspecified: Secondary | ICD-10-CM | POA: Diagnosis not present

## 2024-07-14 DIAGNOSIS — R1319 Other dysphagia: Secondary | ICD-10-CM

## 2024-07-14 MED ORDER — OMEPRAZOLE 20 MG PO CPDR
20.0000 mg | DELAYED_RELEASE_CAPSULE | Freq: Every day | ORAL | 3 refills | Status: AC
  Filled 2024-07-14 – 2024-07-28 (×2): qty 30, 30d supply, fill #0
  Filled 2024-08-28 (×2): qty 30, 30d supply, fill #1
  Filled 2024-10-28: qty 30, 30d supply, fill #2

## 2024-07-14 MED ORDER — NA SULFATE-K SULFATE-MG SULF 17.5-3.13-1.6 GM/177ML PO SOLN
1.0000 | Freq: Once | ORAL | 0 refills | Status: AC
  Filled 2024-07-14 – 2024-11-09 (×2): qty 354, 1d supply, fill #0

## 2024-07-14 NOTE — Patient Instructions (Signed)
 We have sent the following medications to your pharmacy for you to pick up at your convenience: Omeprazole  20 mg once daily.  You have been scheduled for an endoscopy and colonoscopy. Please follow the written instructions given to you at your visit today.  If you use inhalers (even only as needed), please bring them with you on the day of your procedure.  DO NOT TAKE 7 DAYS PRIOR TO TEST- Trulicity (dulaglutide) Ozempic, Wegovy (semaglutide) Mounjaro (tirzepatide) Bydureon Bcise (exanatide extended release)  DO NOT TAKE 1 DAY PRIOR TO YOUR TEST Rybelsus (semaglutide) Adlyxin (lixisenatide) Victoza (liraglutide) Byetta (exanatide) ___________________________________________________________________________  Thank you for trusting me with your gastrointestinal care!   Camie Furbish, PA-C  _______________________________________________________  If your blood pressure at your visit was 140/90 or greater, please contact your primary care physician to follow up on this.  _______________________________________________________  If you are age 42 or older, your body mass index should be between 23-30. Your Body mass index is 28.65 kg/m. If this is out of the aforementioned range listed, please consider follow up with your Primary Care Provider.  If you are age 69 or younger, your body mass index should be between 19-25. Your Body mass index is 28.65 kg/m. If this is out of the aformentioned range listed, please consider follow up with your Primary Care Provider.   ________________________________________________________  The  GI providers would like to encourage you to use MYCHART to communicate with providers for non-urgent requests or questions.  Due to long hold times on the telephone, sending your provider a message by [facility] may be a faster and more efficient way to get a response.  Please allow 48 business hours for a response.  Please remember that this is for non-urgent  requests.  _______________________________________________________  [facility] is using a team-based approach to care.  Your team is made up of your doctor and two to three APPS. Our APPS (Nurse Practitioners and Physician Assistants) work with your physician to ensure care continuity for you. They are fully qualified to address your health concerns and develop a treatment plan. They communicate directly with your gastroenterologist to care for you. Seeing the Advanced Practice Practitioners on your physician's team can help you by facilitating care more promptly, often allowing for earlier appointments, access to diagnostic testing, procedures, and other specialty referrals.

## 2024-07-14 NOTE — Progress Notes (Signed)
 Katrina Phillips [id number] [birth year]   Chief Complaint: Family history of colon polyps, trouble swallowing  Referring Provider: Lendia Boby CROME, NP-C Primary GI MD: Dr. Shila  HPI: Katrina Phillips is a 43 y.o. female with past medical history of ADHD, anxiety/depression, asthma, GERD who presents today for a complaint of GERD and to discuss colonoscopy.    Last seen in office 11/09/2016 by Dr. Nandigam for complaint of intermittent nausea, diarrhea, and abdominal cramping associated with bloating for many years.  Also endorsed heartburn for many years.  Family history of celiac disease.  Had stopped eating gluten about 4 years prior.  She was advised to start ranitidine  and antireflux measures.  Advised to do a 2-week lactose-free diet trial and plan for rechallenge with gluten for 2 weeks to recheck TTG, IgA, ferritin, iron panel, and HLA-DQ2 and DQ 8.  Scheduled for EGD with biopsies.  Underwent EGD 12/04/2016, normal exam with unremarkable biopsies.  Referred by PCP for GERD and family history of colon polyps.    Had a CT scan about a year ago done for right lower quadrant pain and concern for possible appendicitis, with no acute findings though she did have cholelithiasis without evidence of cholecystitis.   Patient states she has history of acid reflux and is taking omeprazole  daily which helps with her symptoms.  Was previously having pretty severe symptoms but lately doing well.  Has had problems with reflux since she was a teenager.  Also reports history of difficult swallowing.  States that food and pills often feel stuck in her throat.  Particular triggers are chicken, carrots, and bread.  She is not having to regurgitate food, able to drink liquids to get food to go down.  Has noticed worsening of symptoms over the last year or 2.  Can have associated hiccups.  States that recently her sister had a colonoscopy due to other GI issues and was found to have a  large polyp.  Her sisters gastroenterologist advised first-degree relatives to have earlier colon cancer screening.  States her sister was 66 when this polyp was found.  She has regular bowel movements twice daily, and occasionally has formed stools but often stools are loose with associated fecal urgency.  Loose stools have been a problem since high school.  She denies any melena or rectal bleeding.  Has had some intermittent upper abdominal pain, bloating, indigestion, all improved on omeprazole .  Previous GI Procedures/Imaging   CT A/P 05/06/2023 No evidence of appendicitis or other acute findings.   Cholelithiasis. No radiographic evidence of cholecystitis.  EGD 12/04/2016 - Normal esophagus. - Normal stomach. Biopsied.  - Normal duodenal bulb and second portion of the duodenum. Biopsied. Path: 1. Surgical [P], duodenal bulb and 2nd portion of duodenum - UNREMARKABLE DUODENAL MUCOSA. - NO FEATURES OF SPRUE, ACTIVE INFLAMMATION OR GRANULOMAS. 2. Surgical [P], gastric antrum and gastric body - MINIMAL CHRONIC INFLAMMATION. GLENWOOD PHLEGM STAIN NEGATIVE FOR HELICOBACTER PYLORI. - NO EVIDENCE OF INTESTINAL METAPLASIA, DYSPLASIA OR MALIGNANCY  Past Medical History:  Diagnosis Date   Abnormal Pap smear of cervix 03/15/2017   HR HPV: +Detected   ADHD    Allergy    SEASONAL   Anxiety    Asthma    AS A CHILD   Depression    GERD (gastroesophageal reflux disease) since my 20's   Gestational diabetes    H/O concussion 1995    Past Surgical History:  Procedure Laterality Date   COLPOSCOPY  2006   ESOPHAGOGASTRODUODENOSCOPY  2018   LEEP     2018   WISDOM TOOTH EXTRACTION      Current Outpatient Medications  Medication Sig Dispense Refill   amphetamine -dextroamphetamine  (ADDERALL) 20 MG tablet Take 0.5 tablets (10 mg total) by mouth 2 (two) times daily. 30 tablet 0   Calcium Carbonate Antacid (TUMS PO) Take by mouth.     cyanocobalamin  (VITAMIN B12) 1000 MCG/ML injection  Inject 1,000 mcg into the muscle every 30 (thirty) days.     estradiol  (ESTRACE  VAGINAL) 0.1 MG/GM vaginal cream Place 1 gram vaginally 3 (three) times a week. 42.5 g 3   FLUoxetine  (PROZAC ) 40 MG capsule Take 1 capsule (40 mg total) by mouth daily. 90 capsule 0   lisdexamfetamine (VYVANSE ) 20 MG capsule Take 1 capsule (20 mg total) by mouth every morning. 30 capsule 0   metFORMIN  (GLUCOPHAGE -XR) 500 MG 24 hr tablet Take 2 tablets (1,000 mg total) by mouth daily with breakfast. 90 tablet 1   omeprazole  (PRILOSEC) 40 MG capsule Take 1 capsule (40 mg total) by mouth daily. 30 capsule 3   lisdexamfetamine (VYVANSE ) 20 MG capsule Take 1 capsule (20 mg total) by mouth in the morning. 30 capsule 0   [START ON 08/21/2024] lisdexamfetamine (VYVANSE ) 20 MG capsule Take 1 capsule (20 mg total) by mouth in the morning. 30 capsule 0   [START ON 07/24/2024] lisdexamfetamine (VYVANSE ) 20 MG capsule Take 1 capsule (20 mg total) by mouth in the morning. 30 capsule 0   lisdexamfetamine (VYVANSE ) 20 MG capsule Take 1 capsule (20 mg total) by mouth in the morning. 30 capsule 0   No current facility-administered medications for this visit.    Allergies as of 07/14/2024   (No Known Allergies)    Family History  Problem Relation Age of Onset   ADD / ADHD Mother    GER disease Mother    Celiac disease Father    Depression Father    Anxiety disorder Father    ADD / ADHD Sister    Colonic polyp Sister    Irritable bowel syndrome Sister    Anxiety disorder Sister    Depression Brother    ADD / ADHD Brother    Depression Maternal Grandmother    Varicose Veins Maternal Grandmother    GER disease Maternal Grandfather    Heart disease Maternal Grandfather    Cancer Maternal Grandfather    Celiac disease Paternal Grandmother    Kidney disease Paternal Grandmother    Melanoma Paternal Aunt    Celiac disease Paternal Uncle    Alcohol abuse Maternal Uncle    Depression Maternal Uncle    Anxiety disorder  Paternal Aunt    Cancer Paternal Aunt     Social History   Tobacco Use   Smoking status: Never    Passive exposure: Past   Smokeless tobacco: Never   Tobacco comments:    N/A  Vaping Use   Vaping status: Never Used  Substance Use Topics   Alcohol use: Yes    Alcohol/week: 1.0 - 4.0 standard drink of alcohol    Comment: I drink maybe once a week if there is an occasion   Drug use: No     Review of Systems:    Constitutional: No unintentional weight loss, fever, chills Cardiovascular: No chest pain Respiratory: No SOB Gastrointestinal: See HPI and otherwise negative Hematologic: No bleeding or bruising   Physical Exam:  Vital signs: BP 120/80   Ht 5' 9 (1.753 m)   Wt 194 lb (88  kg)   BMI 28.65 kg/m   Constitutional: Pleasant, well-appearing female in NAD, alert and cooperative Head:  Normocephalic and atraumatic.  Eyes: No scleral icterus. Respiratory: Respirations even and unlabored. Lungs clear to auscultation bilaterally.  No wheezes, crackles, or rhonchi.  Cardiovascular:  Regular rate and rhythm. No murmurs. No peripheral edema. Gastrointestinal:  Soft, nondistended, nontender. No rebound or guarding. Normal bowel sounds. No appreciable masses or hepatomegaly. Rectal:  Not performed.  Neurologic:  Alert and oriented x4;  grossly normal neurologically.  Skin:   Dry and intact without significant lesions or rashes. Psychiatric: Oriented to person, place and time. Demonstrates good judgement and reason without abnormal affect or behaviors.   RELEVANT LABS AND IMAGING: CBC    Component Value Date/Time   WBC 6.9 06/07/2024 0832   RBC 4.35 06/07/2024 0832   HGB 13.1 06/07/2024 0832   HGB 13.5 06/23/2021 1033   HCT 40.0 06/07/2024 0832   HCT 41.1 06/23/2021 1033   PLT 315.0 06/07/2024 0832   PLT 290 06/23/2021 1033   MCV 91.8 06/07/2024 0832   MCV 94 06/23/2021 1033   MCH 31.0 06/23/2021 1033   MCH 30.5 09/01/2020 0652   MCHC 32.9 06/07/2024 0832   RDW  14.0 06/07/2024 0832   RDW 12.3 06/23/2021 1033   LYMPHSABS 1.3 06/07/2024 0832   LYMPHSABS 1.5 06/23/2021 1033   MONOABS 0.4 06/07/2024 0832   EOSABS 0.2 06/07/2024 0832   EOSABS 0.1 06/23/2021 1033   BASOSABS 0.1 06/07/2024 0832   BASOSABS 0.0 06/23/2021 1033    CMP     Component Value Date/Time   NA 140 06/07/2024 0832   NA 139 06/23/2021 1033   K 4.6 06/07/2024 0832   CL 101 06/07/2024 0832   CO2 29 06/07/2024 0832   GLUCOSE 107 (H) 06/07/2024 0832   BUN 8 06/07/2024 0832   BUN 12 06/23/2021 1033   CREATININE 0.74 06/07/2024 0832   CALCIUM 8.7 06/07/2024 0832   PROT 7.4 06/07/2024 0832   PROT 6.3 06/23/2021 1033   ALBUMIN 4.2 06/07/2024 0832   ALBUMIN 4.2 06/23/2021 1033   AST 10 06/07/2024 0832   ALT 7 06/07/2024 0832   ALKPHOS 39 06/07/2024 0832   BILITOT 0.4 06/07/2024 0832   BILITOT 0.4 06/23/2021 1033     Assessment/Plan:   Family history of colon polyps Patient states her sister was diagnosed with a large colon polyp on recent colonoscopy, at age 23.  Sister's gastroenterologist advised early screening for first-degree relatives.   Based on first-degree relative with large/advanced polyp, guidelines suggest screening at age 19 or 10 years before first-degree relative's diagnosis. Patient is due based on these guidelines for initial screening colonoscopy.  - Schedule colonoscopy. I thoroughly discussed the procedure with the patient to include nature of the procedure, alternatives, benefits, and risks (including but not limited to bleeding, infection, perforation, anesthesia/cardiac/pulmonary complications). Patient verbalized understanding and gave verbal consent to proceed with procedure.   GERD Esophageal dysphagia Patient reports history of acid reflux going back to her teenage years.  Has had severe symptoms in the past but currently doing well on daily omeprazole .  Discussed risks and benefits of long-term PPI use.   She has also had some difficulty  swallowing food and pills, which has been worsening over the last couple of years.  EGD 12/04/2016, normal exam with unremarkable biopsies.  - Schedule EGD with possible dilation to be done with upcoming colonoscopy.  - Continue omeprazole  20 mg once a day   Camie Furbish,  PA-C [facility] 07/14/2024, 11:43 AM  Patient Care Team: Lendia Boby CROME, NP-C as PCP - General (Family Medicine)

## 2024-07-16 ENCOUNTER — Encounter: Payer: Self-pay | Admitting: Gastroenterology

## 2024-07-20 DIAGNOSIS — J3489 Other specified disorders of nose and nasal sinuses: Secondary | ICD-10-CM | POA: Diagnosis not present

## 2024-07-20 DIAGNOSIS — J342 Deviated nasal septum: Secondary | ICD-10-CM | POA: Diagnosis not present

## 2024-07-20 DIAGNOSIS — R0982 Postnasal drip: Secondary | ICD-10-CM | POA: Diagnosis not present

## 2024-07-20 DIAGNOSIS — J343 Hypertrophy of nasal turbinates: Secondary | ICD-10-CM | POA: Diagnosis not present

## 2024-07-20 DIAGNOSIS — J3089 Other allergic rhinitis: Secondary | ICD-10-CM | POA: Diagnosis not present

## 2024-07-20 DIAGNOSIS — R0981 Nasal congestion: Secondary | ICD-10-CM | POA: Diagnosis not present

## 2024-07-25 ENCOUNTER — Other Ambulatory Visit (HOSPITAL_BASED_OUTPATIENT_CLINIC_OR_DEPARTMENT_OTHER): Payer: Self-pay

## 2024-07-27 DIAGNOSIS — F909 Attention-deficit hyperactivity disorder, unspecified type: Secondary | ICD-10-CM | POA: Diagnosis not present

## 2024-07-27 DIAGNOSIS — E785 Hyperlipidemia, unspecified: Secondary | ICD-10-CM | POA: Diagnosis not present

## 2024-07-27 DIAGNOSIS — E0590 Thyrotoxicosis, unspecified without thyrotoxic crisis or storm: Secondary | ICD-10-CM | POA: Diagnosis not present

## 2024-07-27 DIAGNOSIS — R030 Elevated blood-pressure reading, without diagnosis of hypertension: Secondary | ICD-10-CM | POA: Diagnosis not present

## 2024-07-28 ENCOUNTER — Other Ambulatory Visit (HOSPITAL_BASED_OUTPATIENT_CLINIC_OR_DEPARTMENT_OTHER): Payer: Self-pay

## 2024-07-28 MED ORDER — COMIRNATY 30 MCG/0.3ML IM SUSY
0.3000 mL | PREFILLED_SYRINGE | Freq: Once | INTRAMUSCULAR | 0 refills | Status: AC
  Filled 2024-07-28: qty 0.3, 1d supply, fill #0

## 2024-08-03 ENCOUNTER — Ambulatory Visit: Payer: Self-pay

## 2024-08-03 DIAGNOSIS — E538 Deficiency of other specified B group vitamins: Secondary | ICD-10-CM | POA: Diagnosis not present

## 2024-08-03 MED ORDER — CYANOCOBALAMIN 1000 MCG/ML IJ SOLN
1000.0000 ug | Freq: Once | INTRAMUSCULAR | Status: AC
  Administered 2024-08-03: 1000 ug via INTRAMUSCULAR

## 2024-08-03 NOTE — Progress Notes (Signed)
 After obtaining consent, and per orders of vickie Henson, NP-C, injection of B12 given by Edsel CHRISTELLA Kerns. Patient tolerated well.

## 2024-08-09 ENCOUNTER — Telehealth: Payer: Self-pay | Admitting: Gastroenterology

## 2024-08-09 NOTE — Telephone Encounter (Signed)
 ok

## 2024-08-09 NOTE — Telephone Encounter (Signed)
 Good morning Dr. Nandigam,  I received a call from this patient stating that due to her having a scheduling conflict she has to reschedule her double procedure that was scheduled for November the 5 th. Patient was rescheduled for December the 18 th. Please advise.   Thank you.

## 2024-08-16 ENCOUNTER — Encounter: Admitting: Gastroenterology

## 2024-08-28 ENCOUNTER — Other Ambulatory Visit (HOSPITAL_BASED_OUTPATIENT_CLINIC_OR_DEPARTMENT_OTHER): Payer: Self-pay

## 2024-09-15 ENCOUNTER — Other Ambulatory Visit (HOSPITAL_BASED_OUTPATIENT_CLINIC_OR_DEPARTMENT_OTHER): Payer: Self-pay

## 2024-09-15 DIAGNOSIS — F339 Major depressive disorder, recurrent, unspecified: Secondary | ICD-10-CM | POA: Diagnosis not present

## 2024-09-15 MED ORDER — FLUOXETINE HCL 40 MG PO CAPS
40.0000 mg | ORAL_CAPSULE | Freq: Every day | ORAL | 0 refills | Status: DC
  Filled 2024-09-15: qty 90, 90d supply, fill #0

## 2024-09-16 ENCOUNTER — Other Ambulatory Visit (HOSPITAL_BASED_OUTPATIENT_CLINIC_OR_DEPARTMENT_OTHER): Payer: Self-pay

## 2024-09-16 MED ORDER — LISDEXAMFETAMINE DIMESYLATE 30 MG PO CAPS
30.0000 mg | ORAL_CAPSULE | Freq: Every morning | ORAL | 0 refills | Status: AC
  Filled 2024-09-16: qty 30, 30d supply, fill #0

## 2024-09-19 ENCOUNTER — Other Ambulatory Visit (HOSPITAL_BASED_OUTPATIENT_CLINIC_OR_DEPARTMENT_OTHER): Payer: Self-pay

## 2024-09-25 ENCOUNTER — Encounter: Payer: Self-pay | Admitting: Family Medicine

## 2024-09-28 ENCOUNTER — Encounter: Admitting: Gastroenterology

## 2024-10-17 ENCOUNTER — Other Ambulatory Visit (HOSPITAL_BASED_OUTPATIENT_CLINIC_OR_DEPARTMENT_OTHER): Payer: Self-pay

## 2024-10-23 ENCOUNTER — Encounter: Payer: Self-pay | Admitting: Family Medicine

## 2024-10-24 ENCOUNTER — Other Ambulatory Visit: Payer: Self-pay | Admitting: Family Medicine

## 2024-10-26 ENCOUNTER — Ambulatory Visit

## 2024-10-26 DIAGNOSIS — E538 Deficiency of other specified B group vitamins: Secondary | ICD-10-CM

## 2024-10-26 MED ORDER — CYANOCOBALAMIN 1000 MCG/ML IJ SOLN
1000.0000 ug | Freq: Once | INTRAMUSCULAR | Status: AC
  Administered 2024-10-26: 1000 ug via INTRAMUSCULAR

## 2024-10-26 NOTE — Progress Notes (Signed)
 Pt was given B12 injection w/o any complications at this time.

## 2024-10-27 ENCOUNTER — Other Ambulatory Visit (HOSPITAL_BASED_OUTPATIENT_CLINIC_OR_DEPARTMENT_OTHER): Payer: Self-pay

## 2024-10-27 MED ORDER — LISDEXAMFETAMINE DIMESYLATE 30 MG PO CAPS
30.0000 mg | ORAL_CAPSULE | Freq: Every morning | ORAL | 0 refills | Status: AC
  Filled 2024-10-27: qty 30, 30d supply, fill #0

## 2024-10-27 MED ORDER — AMPHETAMINE-DEXTROAMPHETAMINE 20 MG PO TABS
10.0000 mg | ORAL_TABLET | Freq: Two times a day (BID) | ORAL | 0 refills | Status: AC
  Filled 2024-10-27: qty 30, 30d supply, fill #0

## 2024-10-27 MED ORDER — FLUOXETINE HCL 40 MG PO CAPS
40.0000 mg | ORAL_CAPSULE | Freq: Every day | ORAL | 0 refills | Status: AC
  Filled 2024-10-27: qty 90, 90d supply, fill #0

## 2024-10-27 MED ORDER — LISDEXAMFETAMINE DIMESYLATE 30 MG PO CAPS: 30.0000 mg | ORAL_CAPSULE | Freq: Every morning | ORAL | 0 refills | Status: AC

## 2024-10-28 ENCOUNTER — Other Ambulatory Visit (HOSPITAL_BASED_OUTPATIENT_CLINIC_OR_DEPARTMENT_OTHER): Payer: Self-pay

## 2024-10-31 ENCOUNTER — Encounter: Payer: Self-pay | Admitting: Gastroenterology

## 2024-11-03 ENCOUNTER — Encounter: Payer: Self-pay | Admitting: Gastroenterology

## 2024-11-07 ENCOUNTER — Encounter: Payer: Self-pay | Admitting: Gastroenterology

## 2024-11-09 ENCOUNTER — Other Ambulatory Visit (HOSPITAL_BASED_OUTPATIENT_CLINIC_OR_DEPARTMENT_OTHER): Payer: Self-pay

## 2024-11-10 ENCOUNTER — Encounter: Payer: Self-pay | Admitting: Gastroenterology

## 2024-11-10 ENCOUNTER — Ambulatory Visit: Admitting: Gastroenterology

## 2024-11-10 VITALS — BP 114/68 | HR 78 | Temp 98.4°F | Resp 12 | Ht 69.0 in | Wt 194.0 lb

## 2024-11-10 DIAGNOSIS — D122 Benign neoplasm of ascending colon: Secondary | ICD-10-CM

## 2024-11-10 DIAGNOSIS — Z1211 Encounter for screening for malignant neoplasm of colon: Secondary | ICD-10-CM | POA: Diagnosis present

## 2024-11-10 DIAGNOSIS — K449 Diaphragmatic hernia without obstruction or gangrene: Secondary | ICD-10-CM | POA: Diagnosis not present

## 2024-11-10 DIAGNOSIS — Z8371 Family history of adenomatous and serrated polyps: Secondary | ICD-10-CM | POA: Diagnosis not present

## 2024-11-10 DIAGNOSIS — K635 Polyp of colon: Secondary | ICD-10-CM | POA: Diagnosis not present

## 2024-11-10 DIAGNOSIS — R1319 Other dysphagia: Secondary | ICD-10-CM

## 2024-11-10 DIAGNOSIS — R1310 Dysphagia, unspecified: Secondary | ICD-10-CM

## 2024-11-10 DIAGNOSIS — Z83719 Family history of colon polyps, unspecified: Secondary | ICD-10-CM

## 2024-11-10 MED ORDER — FLEET ENEMA RE ENEM
1.0000 | ENEMA | Freq: Once | RECTAL | Status: AC
  Administered 2024-11-10: 1 via RECTAL

## 2024-11-10 MED ORDER — SODIUM CHLORIDE 0.9 % IV SOLN: 500.0000 mL | Freq: Once | INTRAVENOUS | Status: DC

## 2024-11-10 NOTE — Op Note (Signed)
 [facility] Patient Name: Katrina Phillips Procedure Date: 11/10/2024 8:55 AM MRN: [id number] Endoscopist: Gustav ALONSO Mcgee , MD, [id number] Age: 44 Referring MD:  Date of Birth: Mar 10, 1981 Gender: Female Account #: [id number] Procedure:                Upper GI endoscopy Indications:              Dysphagia Medicines:                Monitored Anesthesia Care Procedure:                Pre-Anesthesia Assessment:                           - Prior to the procedure, a History and Physical                            was performed, and patient medications and                            allergies were reviewed. The patient's tolerance of                            previous anesthesia was also reviewed. The risks                            and benefits of the procedure and the sedation                            options and risks were discussed with the patient.                            All questions were answered, and informed consent                            was obtained. Prior Anticoagulants: The patient has                            taken no anticoagulant or antiplatelet agents. ASA                            Grade Assessment: II - A patient with mild systemic                            disease. After reviewing the risks and benefits,                            the patient was deemed in satisfactory condition to                            undergo the procedure.                           After obtaining informed consent, the endoscope was  passed under direct vision. Throughout the                            procedure, the patient's blood pressure, pulse, and                            oxygen saturations were monitored continuously. The                            Olympus Scope [id number] was introduced through the                            mouth, and advanced to the second part of duodenum.                            The upper GI endoscopy was  accomplished without                            difficulty. The patient tolerated the procedure                            well. Scope In: Scope Out: Findings:                 The Z-line was regular and was found 36 cm from the                            incisors.                           A 2 cm hiatal hernia was present.                           The stomach was normal.                           The cardia and gastric fundus were normal on                            retroflexion.                           The examined duodenum was normal.                           No endoscopic abnormality was evident in the                            esophagus to explain the patient's complaint of                            dysphagia. It was decided, however, to proceed with                            dilation of the entire esophagus. The scope was  withdrawn. Dilation was performed with a Maloney                            dilator with no resistance at 54 Fr. The dilation                            site was examined following endoscope reinsertion                            and showed no change. Complications:            No immediate complications. Estimated Blood Loss:     Estimated blood loss was minimal. Impression:               - Z-line regular, 36 cm from the incisors.                           - 2 cm hiatal hernia.                           - Normal stomach.                           - Normal examined duodenum.                           - No endoscopic esophageal abnormality to explain                            patient's dysphagia. Esophagus dilated. Dilated.                           - No specimens collected. Recommendation:           - Resume previous diet.                           - Continue present medications. Bowman Higbie V. Mareena Cavan, MD 11/10/2024 9:48:54 AM This report has been signed electronically.

## 2024-11-10 NOTE — Op Note (Signed)
 [facility] Patient Name: Katrina Phillips Procedure Date: 11/10/2024 8:54 AM MRN: [id number] Endoscopist: Gustav ALONSO Mcgee , MD, [id number] Age: 44 Referring MD:  Date of Birth: 20-Apr-1981 Gender: Female Account #: [id number] Procedure:                Colonoscopy Indications:              Colon cancer screening in patient with 1st-degree                            relative having advanced adenoma of the colon                            before age 26 (Sister at age 15) Medicines:                Monitored Anesthesia Care Procedure:                Pre-Anesthesia Assessment:                           - Prior to the procedure, a History and Physical                            was performed, and patient medications and                            allergies were reviewed. The patient's tolerance of                            previous anesthesia was also reviewed. The risks                            and benefits of the procedure and the sedation                            options and risks were discussed with the patient.                            All questions were answered, and informed consent                            was obtained. Prior Anticoagulants: The patient has                            taken no anticoagulant or antiplatelet agents. ASA                            Grade Assessment: II - A patient with mild systemic                            disease. After reviewing the risks and benefits,                            the patient was deemed in satisfactory condition to  undergo the procedure.                           After obtaining informed consent, the colonoscope                            was passed under direct vision. Throughout the                            procedure, the patient's blood pressure, pulse, and                            oxygen saturations were monitored continuously. The                            Olympus Scope [id number]  was introduced through the                            anus and advanced to the the cecum, identified by                            appendiceal orifice and ileocecal valve. The                            colonoscopy was performed without difficulty. The                            patient tolerated the procedure well. The quality                            of the bowel preparation was inadequate and                            unsatisfactory. The ileocecal valve, appendiceal                            orifice, and rectum were photographed. Scope In: 9:15:52 AM Scope Out: 9:34:26 AM Scope Withdrawal Time: 0 hours 11 minutes 26 seconds  Total Procedure Duration: 0 hours 18 minutes 34 seconds  Findings:                 The perianal and digital rectal examinations were                            normal.                           A 20 mm polyp was found in the ascending colon. The                            polyp was mucous-capped and sessile. The polyp was                            removed with a piecemeal technique using a cold  snare. Resection and retrieval were complete.                           Semi-liquid stool was found in the entire colon,                            interfering with visualization. Complications:            No immediate complications. Estimated Blood Loss:     Estimated blood loss was minimal. Impression:               - Preparation of the colon was unsatisfactory.                           - Preparation of the colon was inadequate.                           - One 20 mm polyp in the ascending colon, removed                            piecemeal using a cold snare. Resected and                            retrieved.                           - Stool in the entire examined colon. Recommendation:           - Resume previous diet.                           - Continue present medications.                           - Await pathology results.                            - Repeat colonoscopy at the next available                            appointment because the bowel preparation was                            suboptimal.                           - For future colonoscopy the patient will require                            an extended preparation. If there are any                            questions, please contact the gastroenterologist. Gustav ALONSO Mcgee, MD 11/10/2024 9:46:35 AM This report has been signed electronically.

## 2024-11-10 NOTE — Progress Notes (Signed)
 Pt's states no medical or surgical changes since previsit or office visit.  Patient's last BM not clear and RN unable to see bottom of toilet. Dr. Nandigam made aware. Per Dr. Nandigam to do fleet enema. Fleet enema performed.   BM cloudy but able to see to bottom of toilet. Dr. Nandigam made aware.

## 2024-11-10 NOTE — Progress Notes (Signed)
 [facility] History and Physical   Primary Care Physician:  Lendia Boby CROME, NP-C   Reason for Procedure:  Dysphagia and family h/o colon polyps sister advanced adenoma at age 42  Plan:    EGD and colonoscopy with possible interventions as needed     HPI: Katrina Phillips is a very pleasant 44 y.o. female here for EGD and colonoscopy for dysphagia and family h/o colon polyps sister advanced adenoma at age 1.   The risks and benefits as well as alternatives of endoscopic procedure(s) have been discussed and reviewed.  The patient was provided an opportunity to ask questions and all were answered. The patient agreed with the plan and demonstrated an understanding of the instructions.   Past Medical History:  Diagnosis Date   Abnormal Pap smear of cervix 03/15/2017   HR HPV: +Detected   ADHD    Allergy    SEASONAL   Anxiety    Asthma    AS A CHILD   Depression    GERD (gastroesophageal reflux disease) since my 20's   Gestational diabetes    H/O concussion 1995    Past Surgical History:  Procedure Laterality Date   COLPOSCOPY  2006   ESOPHAGOGASTRODUODENOSCOPY  2018   LEEP     2018   WISDOM TOOTH EXTRACTION      Prior to Admission medications  Medication Sig Start Date End Date Taking? Authorizing Provider  amphetamine -dextroamphetamine  (ADDERALL) 20 MG tablet Take 0.5 tablets (10 mg total) by mouth 2 (two) times daily. 06/28/24  Yes   amphetamine -dextroamphetamine  (ADDERALL) 20 MG tablet Take 0.5 tablets (10 mg total) by mouth 2 (two) times daily. 10/27/24  Yes   FLUoxetine  (PROZAC ) 40 MG capsule Take 1 capsule (40 mg total) by mouth daily. 10/27/24  Yes   lisdexamfetamine  (VYVANSE ) 30 MG capsule Take 1 capsule (30 mg total) by mouth every morning. 09/15/24  Yes   lisdexamfetamine  (VYVANSE ) 30 MG capsule Take 1 capsule (30 mg total) by mouth in the morning. 11/24/24  Yes   metFORMIN  (GLUCOPHAGE -XR) 500 MG 24 hr tablet Take 2 tablets (1,000 mg total)  by mouth daily with breakfast. 06/13/24  Yes Henson, Vickie L, NP-C  Na Sulfate-K Sulfate-Mg Sulfate concentrate (SUPREP) 17.5-3.13-1.6 GM/177ML SOLN Take 1 kit (354 mLs total) by mouth once for 1 dose. 07/14/24 11/10/24 Yes Heinz, Camie BRAVO, PA-C  omeprazole  (PRILOSEC) 20 MG capsule Take 1 capsule (20 mg total) by mouth daily. 07/14/24  Yes Heinz, Sara E, PA-C  Calcium Carbonate Antacid (TUMS PO) Take by mouth.    [provider]  cyanocobalamin  (VITAMIN B12) 1000 MCG/ML injection Inject 1,000 mcg into the muscle every 30 (thirty) days.    [provider]  estradiol  (ESTRACE  VAGINAL) 0.1 MG/GM vaginal cream Place 1 gram vaginally 3 (three) times a week. 05/26/24   Chrzanowski, Jami B, NP  lisdexamfetamine  (VYVANSE ) 20 MG capsule Take 1 capsule (20 mg total) by mouth every morning. Patient not taking: Reported on 11/10/2024 03/17/24     lisdexamfetamine  (VYVANSE ) 20 MG capsule Take 1 capsule (20 mg total) by mouth in the morning. Patient not taking: Reported on 11/10/2024 04/27/24     lisdexamfetamine  (VYVANSE ) 20 MG capsule Take 1 capsule (20 mg total) by mouth in the morning. Patient not taking: Reported on 11/10/2024 08/21/24     lisdexamfetamine  (VYVANSE ) 20 MG capsule Take 1 capsule (20 mg total) by mouth in the morning. Patient not taking: Reported on 11/10/2024 07/24/24     lisdexamfetamine  (VYVANSE ) 20 MG  capsule Take 1 capsule (20 mg total) by mouth in the morning. Patient not taking: Reported on 11/10/2024 06/28/24     lisdexamfetamine  (VYVANSE ) 30 MG capsule Take 1 capsule (30 mg total) by mouth in the morning. Patient not taking: Reported on 11/10/2024 10/27/24       Current Outpatient Medications  Medication Sig Dispense Refill   amphetamine -dextroamphetamine  (ADDERALL) 20 MG tablet Take 0.5 tablets (10 mg total) by mouth 2 (two) times daily. 30 tablet 0   amphetamine -dextroamphetamine  (ADDERALL) 20 MG tablet Take 0.5 tablets (10 mg total) by mouth 2 (two) times daily. 30 tablet 0    FLUoxetine  (PROZAC ) 40 MG capsule Take 1 capsule (40 mg total) by mouth daily. 90 capsule 0   lisdexamfetamine  (VYVANSE ) 30 MG capsule Take 1 capsule (30 mg total) by mouth every morning. 30 capsule 0   [START ON 11/24/2024] lisdexamfetamine  (VYVANSE ) 30 MG capsule Take 1 capsule (30 mg total) by mouth in the morning. 30 capsule 0   metFORMIN  (GLUCOPHAGE -XR) 500 MG 24 hr tablet Take 2 tablets (1,000 mg total) by mouth daily with breakfast. 90 tablet 1   Na Sulfate-K Sulfate-Mg Sulfate concentrate (SUPREP) 17.5-3.13-1.6 GM/177ML SOLN Take 1 kit (354 mLs total) by mouth once for 1 dose. 354 mL 0   omeprazole  (PRILOSEC) 20 MG capsule Take 1 capsule (20 mg total) by mouth daily. 30 capsule 3   Calcium Carbonate Antacid (TUMS PO) Take by mouth.     cyanocobalamin  (VITAMIN B12) 1000 MCG/ML injection Inject 1,000 mcg into the muscle every 30 (thirty) days.     estradiol  (ESTRACE  VAGINAL) 0.1 MG/GM vaginal cream Place 1 gram vaginally 3 (three) times a week. 42.5 g 3   lisdexamfetamine  (VYVANSE ) 20 MG capsule Take 1 capsule (20 mg total) by mouth every morning. (Patient not taking: Reported on 11/10/2024) 30 capsule 0   lisdexamfetamine  (VYVANSE ) 20 MG capsule Take 1 capsule (20 mg total) by mouth in the morning. (Patient not taking: Reported on 11/10/2024) 30 capsule 0   lisdexamfetamine  (VYVANSE ) 20 MG capsule Take 1 capsule (20 mg total) by mouth in the morning. (Patient not taking: Reported on 11/10/2024) 30 capsule 0   lisdexamfetamine  (VYVANSE ) 20 MG capsule Take 1 capsule (20 mg total) by mouth in the morning. (Patient not taking: Reported on 11/10/2024) 30 capsule 0   lisdexamfetamine  (VYVANSE ) 20 MG capsule Take 1 capsule (20 mg total) by mouth in the morning. (Patient not taking: Reported on 11/10/2024) 30 capsule 0   lisdexamfetamine  (VYVANSE ) 30 MG capsule Take 1 capsule (30 mg total) by mouth in the morning. (Patient not taking: Reported on 11/10/2024) 30 capsule 0   Current Facility-Administered  Medications  Medication Dose Route Frequency Provider Last Rate Last Admin   0.9 %  sodium chloride  infusion  500 mL Intravenous Once Zamere Pasternak V, MD        Allergies as of 11/10/2024   (No Known Allergies)    Family History  Problem Relation Age of Onset   ADD / ADHD Mother    GER disease Mother    Celiac disease Father    Depression Father    Anxiety disorder Father    ADD / ADHD Sister    Colonic polyp Sister    Irritable bowel syndrome Sister    Anxiety disorder Sister    Depression Brother    ADD / ADHD Brother    Depression Maternal Grandmother    Varicose Veins Maternal Grandmother    GER disease Maternal Grandfather  Heart disease Maternal Grandfather    Cancer Maternal Grandfather    Celiac disease Paternal Grandmother    Kidney disease Paternal Grandmother    Melanoma Paternal Aunt    Celiac disease Paternal Uncle    Alcohol abuse Maternal Uncle    Depression Maternal Uncle    Anxiety disorder Paternal Aunt    Cancer Paternal Aunt     Social History   Socioeconomic History   Marital status: Married    Spouse name: Not on file   Number of children: 0   Years of education: Not on file   Highest education level: Master's degree (e.g., MA, MS, MEng, MEd, MSW, MBA)  Occupational History   Occupation: grad student/assistant  Tobacco Use   Smoking status: Never    Passive exposure: Past   Smokeless tobacco: Never   Tobacco comments:    N/A  Vaping Use   Vaping status: Never Used  Substance and Sexual Activity   Alcohol use: Yes    Alcohol/week: 1.0 - 4.0 standard drink of alcohol    Comment: I drink maybe once a week if there is an occasion   Drug use: No   Sexual activity: Yes    Partners: Male    Birth control/protection: Condom    Comment: menarche 44yo, sexual debut 44yo  Other Topics Concern   Not on file  Social History Narrative   Not on file   Social Drivers of Health   Tobacco Use: Low Risk (11/10/2024)   Patient History     Smoking Tobacco Use: Never    Smokeless Tobacco Use: Never    Passive Exposure: Past  Financial Resource Strain: Low Risk (04/06/2024)   Overall Financial Resource Strain (CARDIA)    Difficulty of Paying Living Expenses: Not very hard  Food Insecurity: No Food Insecurity (04/06/2024)   Epic    Worried About Radiation Protection Practitioner of Food in the Last Year: Never true    Ran Out of Food in the Last Year: Never true  Transportation Needs: No Transportation Needs (04/06/2024)   Epic    Lack of Transportation (Medical): No    Lack of Transportation (Non-Medical): No  Physical Activity: Inactive (04/06/2024)   Exercise Vital Sign    Days of Exercise per Week: 0 days    Minutes of Exercise per Session: Not on file  Stress: Stress Concern Present (04/06/2024)   [employer] of Occupational Health - Occupational Stress Questionnaire    Feeling of Stress: Very much  Social Connections: Socially Isolated (04/06/2024)   Social Connection and Isolation Panel    Frequency of Communication with Friends and Family: Once a week    Frequency of Social Gatherings with Friends and Family: Never    Attends Religious Services: Never    Database Administrator or Organizations: No    Attends Engineer, Structural: Not on file    Marital Status: Married  Catering Manager Violence: Not on file  Depression (PHQ2-9): High Risk (05/26/2024)   Depression (PHQ2-9)    PHQ-2 Score: 12  Alcohol Screen: Low Risk (04/06/2024)   Alcohol Screen    Last Alcohol Screening Score (AUDIT): 2  Housing: Low Risk (04/06/2024)   Epic    Unable to Pay for Housing in the Last Year: No    Number of Times Moved in the Last Year: 0    Homeless in the Last Year: No  Utilities: Not on file  Health Literacy: Not on file    Review of Systems:  All  other review of systems negative except as mentioned in the HPI.  Physical Exam: Vital signs in last 24 hours: BP 125/78   Pulse 91   Temp 98.4 F (36.9 C) (Temporal)   Ht 5' 9  (1.753 m)   Wt 194 lb (88 kg)   LMP 10/26/2024 (Approximate)   SpO2 96%   BMI 28.65 kg/m  General:   Alert, NAD Lungs:  Clear .   Heart:  Regular rate and rhythm Abdomen:  Soft, nontender and nondistended. Neuro/Psych:  Alert and cooperative. Normal mood and affect. A and O x 3  Reviewed labs, radiology imaging, old records and pertinent past GI work up  Patient is appropriate for planned procedure(s) and anesthesia in an ambulatory setting   K. Veena Kelsi Benham , MD [phone]

## 2024-11-10 NOTE — Progress Notes (Signed)
 Report to PACU, RN, vss, BBS= Clear.

## 2024-11-10 NOTE — Progress Notes (Signed)
 Called to room to assist during endoscopic procedure.  Patient ID and intended procedure confirmed with present staff. Received instructions for my participation in the procedure from the performing physician.

## 2024-11-14 ENCOUNTER — Telehealth: Payer: Self-pay | Admitting: *Deleted

## 2024-11-14 ENCOUNTER — Telehealth: Payer: Self-pay

## 2024-11-14 NOTE — Telephone Encounter (Signed)
 No answer, left message to call if having any issues or concerns, B.Vester Titsworth RN

## 2024-11-15 LAB — SURGICAL PATHOLOGY

## 2024-11-16 ENCOUNTER — Encounter: Payer: Self-pay | Admitting: Gastroenterology

## 2024-11-30 ENCOUNTER — Ambulatory Visit

## 2024-12-07 ENCOUNTER — Ambulatory Visit: Admitting: Family Medicine

## 2024-12-12 ENCOUNTER — Ambulatory Visit: Admitting: Family Medicine

## 2025-05-29 ENCOUNTER — Ambulatory Visit: Admitting: Radiology
